# Patient Record
Sex: Female | Born: 1995 | Race: White | Hispanic: No | Marital: Single | Smoking: Never smoker
Health system: Southern US, Community
[De-identification: ages and names within clinical notes are randomized; demographics above are authoritative.]

## PROBLEM LIST (undated history)

## (undated) DIAGNOSIS — E039 Hypothyroidism, unspecified: Secondary | ICD-10-CM

## (undated) DIAGNOSIS — Z789 Other specified health status: Secondary | ICD-10-CM

## (undated) DIAGNOSIS — E079 Disorder of thyroid, unspecified: Secondary | ICD-10-CM

## (undated) HISTORY — DX: Other specified health status: Z78.9

## (undated) HISTORY — DX: Disorder of thyroid, unspecified: E07.9

---
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
## 2015-01-09 DIAGNOSIS — E034 Atrophy of thyroid (acquired): Secondary | ICD-10-CM | POA: Insufficient documentation

## 2015-03-24 ENCOUNTER — Ambulatory Visit: Admission: EM | Admit: 2015-03-24 | Discharge: 2015-03-24 | Discharge status: Absent without leave | Payer: Self-pay

## 2015-03-25 ENCOUNTER — Other Ambulatory Visit: Payer: Self-pay

## 2015-03-25 DIAGNOSIS — Z0000 Encounter for general adult medical examination without abnormal findings: Secondary | ICD-10-CM | POA: Insufficient documentation

## 2015-03-25 DIAGNOSIS — E039 Hypothyroidism, unspecified: Secondary | ICD-10-CM | POA: Insufficient documentation

## 2015-03-25 DIAGNOSIS — Z111 Encounter for screening for respiratory tuberculosis: Secondary | ICD-10-CM | POA: Insufficient documentation

## 2015-03-25 DIAGNOSIS — Z3009 Encounter for other general counseling and advice on contraception: Secondary | ICD-10-CM | POA: Insufficient documentation

## 2015-03-26 ENCOUNTER — Encounter: Payer: Self-pay | Admitting: Family Medicine

## 2015-03-26 ENCOUNTER — Ambulatory Visit (INDEPENDENT_AMBULATORY_CARE_PROVIDER_SITE_OTHER): Payer: BC Managed Care – PPO | Admitting: Family Medicine

## 2015-03-26 VITALS — BP 100/80 | HR 80 | Ht 68.0 in | Wt 179.0 lb

## 2015-03-26 DIAGNOSIS — Z3041 Encounter for surveillance of contraceptive pills: Secondary | ICD-10-CM

## 2015-03-26 DIAGNOSIS — Z0000 Encounter for general adult medical examination without abnormal findings: Secondary | ICD-10-CM | POA: Diagnosis not present

## 2015-03-26 MED ORDER — NORGESTIMATE-ETH ESTRADIOL 0.25-35 MG-MCG PO TABS: 1.0000 | ORAL_TABLET | Freq: Every day | ORAL | Status: DC

## 2015-03-26 NOTE — Progress Notes (Signed)
Name: Kendra Salinas   MRN: [id number]    DOB: 1996/09/09   Date:03/26/2015       Progress Note  Subjective  Chief Complaint  Chief Complaint  Patient presents with  . Annual Exam    pain during intercourse at times    HPI Comments: Contraceptive management/ No side effects/ Tolerating well/ Effective/  Gynecologic Exam The patient's pertinent negatives include no genital itching, genital lesions, genital odor, genital rash, missed menses, pelvic pain, vaginal bleeding or vaginal discharge. This is a new problem. The current episode started today. Pertinent negatives include no abdominal pain, anorexia, back pain, chills, constipation, diarrhea, discolored urine, dysuria, fever, flank pain, frequency, headaches, hematuria, joint pain, joint swelling, nausea, painful intercourse, rash, sore throat, urgency or vomiting. The vaginal discharge was normal. The vaginal bleeding is typical of menses. She has not been passing clots. She has not been passing tissue. Nothing aggravates the symptoms. She uses oral contraceptives for contraception. Her menstrual history has been regular. There is no history of an abdominal surgery, a Cesarean section, an ectopic pregnancy, endometriosis, a gynecological surgery, herpes simplex, menorrhagia, metrorrhagia, miscarriage, ovarian cysts, perineal abscess, PID, an STD, a terminated pregnancy or vaginosis.    No problem-specific assessment & plan notes found for this encounter.   No past medical history on file.  No past surgical history on file.  No family history on file.  History   Social History  . Marital Status: Single    Spouse Name: N/A  . Number of Children: N/A  . Years of Education: N/A   Occupational History  . Not on file.   Social History Main Topics  . Smoking status: Never Smoker   . Smokeless tobacco: Not on file  . Alcohol Use: No  . Drug Use: No  . Sexual Activity: Not on file   Other Topics Concern  . Not on file    Social History Narrative  . No narrative on file    Allergies  Allergen Reactions  . Sulfa Antibiotics      Review of Systems  Constitutional: Negative for fever, chills, weight loss and malaise/fatigue.  HENT: Negative for ear discharge, ear pain and sore throat.   Eyes: Negative for blurred vision.  Respiratory: Negative for cough, sputum production, shortness of breath and wheezing.   Cardiovascular: Negative for chest pain, palpitations and leg swelling.  Gastrointestinal: Negative for heartburn, nausea, vomiting, abdominal pain, diarrhea, constipation, blood in stool, melena and anorexia.  Genitourinary: Negative for dysuria, urgency, frequency, hematuria, flank pain, vaginal discharge, pelvic pain, menorrhagia and missed menses.  Musculoskeletal: Negative for myalgias, back pain, joint pain and neck pain.  Skin: Negative for rash.  Neurological: Negative for dizziness, tingling, sensory change, focal weakness and headaches.  Endo/Heme/Allergies: Negative for environmental allergies and polydipsia. Does not bruise/bleed easily.  Psychiatric/Behavioral: Negative for depression and suicidal ideas. The patient is not nervous/anxious and does not have insomnia.      Objective  Filed Vitals:   03/26/15 0842  BP: 100/80  Pulse: 80  Height: 5\' 8"  (1.727 m)  Weight: 179 lb (81.194 kg)    Physical Exam  Constitutional: She is oriented to person, place, and time and well-developed, well-nourished, and in no distress.  HENT:  Head: Normocephalic and atraumatic.  Right Ear: External ear normal.  Left Ear: External ear normal.  Nose: Nose normal.  Mouth/Throat: Oropharynx is clear and moist.  Eyes: Conjunctivae and EOM are normal. Pupils are equal, round, and reactive to light.  Neck: Normal range of motion. Neck supple. No JVD present. No thyromegaly present.  Cardiovascular: Regular rhythm.  Exam reveals no gallop and no friction rub.   No murmur heard. Pulmonary/Chest:  Effort normal and breath sounds normal. No respiratory distress. She has no wheezes. She has no rales. She exhibits no tenderness.  Abdominal: Soft. Bowel sounds are normal. She exhibits no distension. There is no tenderness. There is no guarding.  Genitourinary: Rectum normal, vagina normal, uterus normal, cervix normal, right adnexa normal, left adnexa normal and vulva normal. Guaiac negative stool. No vaginal discharge found.  cevicaleversion  Musculoskeletal: Normal range of motion.  Lymphadenopathy:    She has no cervical adenopathy.  Neurological: She is alert and oriented to person, place, and time.  Skin: Skin is warm.  Psychiatric: Mood and affect normal.      No results found for this or any previous visit (from the past 2160 hour(s)).   Assessment & Plan  Problem List Items Addressed This Visit    None    Visit Diagnoses    Encounter for surveillance of contraceptive pills    -  Primary    Relevant Medications    norgestimate-ethinyl estradiol (SPRINTEC 28) 0.25-35 MG-MCG tablet         Dr. [facility]  03/26/2015

## 2015-03-31 LAB — PAP IG AND HPV HIGH-RISK
HPV, HIGH-RISK: NEGATIVE
PAP Smear Comment: 0

## 2015-11-05 ENCOUNTER — Encounter: Payer: Self-pay | Admitting: Family Medicine

## 2015-11-05 ENCOUNTER — Ambulatory Visit (INDEPENDENT_AMBULATORY_CARE_PROVIDER_SITE_OTHER): Payer: Managed Care, Other (non HMO) | Admitting: Family Medicine

## 2015-11-05 VITALS — BP 120/80 | HR 80 | Ht 68.0 in | Wt 169.0 lb

## 2015-11-05 DIAGNOSIS — K529 Noninfective gastroenteritis and colitis, unspecified: Secondary | ICD-10-CM | POA: Diagnosis not present

## 2015-11-05 LAB — HEMOCCULT GUIAC POC 1CARD (OFFICE): Fecal Occult Blood, POC: NEGATIVE

## 2015-11-05 NOTE — Progress Notes (Signed)
Name: Kendra Salinas   MRN: [id number]    DOB: April 07, 1996   Date:11/05/2015       Progress Note  Subjective  Chief Complaint  Chief Complaint  Patient presents with  . Irregular bowel movements    have diarrhea for approx 4-7 days at a time with "mucus in it" then constipated for approx 4 days after- has tried pepto bismol otc that helps temporarily    Diarrhea  This is a chronic problem. The current episode started more than 1 month ago. The problem occurs 5 to 10 times per day. The problem has been gradually worsening. The stool consistency is described as mucous. Associated symptoms include abdominal pain and increased flatus. Pertinent negatives include no bloating, chills, fever or weight loss. Associated symptoms comments: Pain radiates to lower back. The symptoms are aggravated by dairy products. There are no known risk factors. She has tried change of diet for the symptoms. The treatment provided no relief. Her past medical history is significant for irritable bowel syndrome.    No problem-specific assessment & plan notes found for this encounter.   Past Medical History  Diagnosis Date  . Thyroid disease     History reviewed. No pertinent past surgical history.  History reviewed. No pertinent family history.  Social History   Social History  . Marital Status: Single    Spouse Name: N/A  . Number of Children: N/A  . Years of Education: N/A   Occupational History  . Not on file.   Social History Main Topics  . Smoking status: Never Smoker   . Smokeless tobacco: Not on file  . Alcohol Use: No  . Drug Use: No  . Sexual Activity: Yes   Other Topics Concern  . Not on file   Social History Narrative    Allergies  Allergen Reactions  . Sulfa Antibiotics      Review of Systems  Constitutional: Negative for fever, chills and weight loss.  Gastrointestinal: Positive for abdominal pain, diarrhea and flatus. Negative for bloating.     Objective  Filed  Vitals:   11/05/15 1057  BP: 120/80  Pulse: 80  Height:  (1.727 m)  Weight: 169 lb (76.658 kg)    Physical Exam  Constitutional: She is well-developed, well-nourished, and in no distress. No distress.  HENT:  Head: Normocephalic and atraumatic.  Right Ear: External ear normal.  Left Ear: External ear normal.  Nose: Nose normal.  Mouth/Throat: Oropharynx is clear and moist.  Eyes: Conjunctivae and EOM are normal. Pupils are equal, round, and reactive to light. Right eye exhibits no discharge. Left eye exhibits no discharge.  Neck: Normal range of motion. Neck supple. No JVD present. No thyromegaly present.  Cardiovascular: Normal rate, regular rhythm, normal heart sounds and intact distal pulses.  Exam reveals no gallop and no friction rub.   No murmur heard. Pulmonary/Chest: Effort normal and breath sounds normal.  Abdominal: Soft. Bowel sounds are normal. She exhibits no mass. There is no splenomegaly or hepatomegaly. There is no tenderness. There is no guarding.  Genitourinary: Rectum normal. Rectal exam shows no external hemorrhoid, no internal hemorrhoid, no mass and no tenderness. Guaiac negative stool.  Musculoskeletal: Normal range of motion. She exhibits no edema.  Lymphadenopathy:    She has no cervical adenopathy.  Neurological: She is alert. She has normal reflexes.  Skin: Skin is warm and dry. She is not diaphoretic.  Psychiatric: Mood and affect normal.      Assessment & Plan  Problem List Items Addressed This Visit    None    Visit Diagnoses    Chronic diarrhea    -  Primary    Relevant Orders    CBC w/Diff/Platelet    Renal Function Panel    POCT Occult Blood Stool (Completed)    Ambulatory referral to Gastroenterology         Dr. Elizabeth Sauer [facility]  11/05/2015

## 2015-11-05 NOTE — Patient Instructions (Signed)
Crohn Disease Crohn disease is a long-lasting (chronic) disease that affects your gastrointestinal (GI) tract. It often causes irritation and swelling (inflammation) in your small intestine and the beginning of your large intestine. However, it can affect any part of your GI tract. Crohn disease is part of a group of illnesses that are known as inflammatory bowel disease (IBD). Crohn disease may start slowly and get worse over time. Symptoms may come and go. They may also disappear for months or even years at a time (remission). CAUSES The exact cause of Crohn disease is not known. It may be a response that causes your body's defense system (immune system) to mistakenly attack healthy cells and tissues (autoimmune response). Your genes and your environment may also play a role. RISK FACTORS You may be at greater risk for Crohn disease if you:  Have other family members with Crohn disease or another IBD.  Use any tobacco products, including cigarettes, chewing tobacco, or electronic cigarettes.  Are in your 20s.  Have Eastern European ancestry. SIGNS AND SYMPTOMS The main signs and symptoms of Crohn disease involve your GI tract. These include:  Diarrhea.  Rectal bleeding.  An urgent need to move your bowels.  The feeling that you are not finished having a bowel movement.  Abdominal pain or cramping.  Constipation. General signs and symptoms of Crohn disease may also include:  Unexplained weight loss.  Fatigue.  Fever.  Nausea.  Loss of appetite.  Joint pain  Changes in vision.  Red bumps on your skin. DIAGNOSIS Your health care provider may suspect Crohn disease based on your symptoms and your medical history. Your health care provider will do a physical exam. You may need to see a health care provider who specializes in diseases of the digestive tract (gastroenterologist). You may also have tests to help your health care providers make a diagnosis. These may  include:  Blood tests.  Stool sample tests.  Imaging tests, such as X-rays and CT scans.  Tests to examine the inside of your intestines using a long, flexible tube that has a light and a camera on the end (endoscopy or colonoscopy).  A procedure to take tissue samples from inside your bowel (biopsy) to be examined under a microscope. TREATMENT  There is no cure for Crohn disease. Treatment will focus on managing your symptoms. Crohn disease affects each person differently. Your treatment may include:  Resting your bowels. Drinking only clear liquids or getting nutrition through an IV for a period of time gives your bowels a chance to heal because they are not passing stools.  Medicines. These may be used alone or in combination (combination therapy). These may include antibiotic medicines. You may be given medicines that help to:  Reduce inflammation.  Control your immune system activity.  Fight infections.  Relieve cramps and prevent diarrhea.  Control your pain.  Surgery. You may need surgery if:  Medicines and other treatments are no longer working.  You develop complications from severe Crohn disease.  A section of your intestine becomes so damaged that it needs to be removed. HOME CARE INSTRUCTIONS  Take medicines only as directed by your health care provider.  If you were prescribed an antibiotic medicine, finish it all even if you start to feel better.  Keep all follow-up visits as directed by your health care provider. This is important.  Talk with your health care provider about changing your diet. This may help your symptoms. Your health care provide may recommend changes, such   as:  Drinking more fluids.  Avoiding milk and other foods that contain lactose.  Eating a low-fat diet.  Avoiding high-fiber foods, such as popcorn and nuts.  Avoiding carbonated beverages, such as soda.  Eating smaller meals more often rather than eating large  meals.  Keeping a food diary to identify foods that make your symptoms better or worse.  Do not use any tobacco products, including cigarettes, chewing tobacco, or electronic cigarettes. If you need help quitting, ask your health care provider.  Limit alcohol intake to no more than 1 drink per day for nonpregnant women and 2 drinks per day for men. One drink equals 12 ounces of beer, 5 ounces of wine, or 1 ounces of hard liquor.  Exercise daily or as directed by your health care provider. SEEK MEDICAL CARE IF:  You have diarrhea, abdominal cramps, and other gastrointestinal problems that are present almost all of the time.  Your symptoms do not improve with treatment.  You continue to lose weight.  You develop a rash or sores on your skin.  You develop eye problems.  You have a fever.   Your symptoms get worse.  You develop new symptoms. SEEK IMMEDIATE MEDICAL CARE IF:  You have bloody diarrhea.  You develop severe abdominal pain.  You cannot pass stools.   This information is not intended to replace advice given to you by your health care provider. Make sure you discuss any questions you have with your health care provider.   Document Released: 07/13/2005 Document Revised: 10/24/2014 Document Reviewed: 05/21/2014 Elsevier Interactive Patient Education 2016 [employer]  

## 2015-11-06 LAB — CBC WITH DIFFERENTIAL/PLATELET
BASOS: 1 %
Basophils Absolute: 0.1 10*3/uL (ref 0.0–0.2)
EOS (ABSOLUTE): 0.2 10*3/uL (ref 0.0–0.4)
EOS: 3 %
HEMATOCRIT: 39.3 % (ref 34.0–46.6)
Hemoglobin: 12.9 g/dL (ref 11.1–15.9)
IMMATURE GRANULOCYTES: 0 %
Immature Grans (Abs): 0 10*3/uL (ref 0.0–0.1)
Lymphocytes Absolute: 3.2 10*3/uL — ABNORMAL HIGH (ref 0.7–3.1)
Lymphs: 45 %
MCH: 27.9 pg (ref 26.6–33.0)
MCHC: 32.8 g/dL (ref 31.5–35.7)
MCV: 85 fL (ref 79–97)
Monocytes Absolute: 0.3 10*3/uL (ref 0.1–0.9)
Monocytes: 5 %
NEUTROS ABS: 3.3 10*3/uL (ref 1.4–7.0)
NEUTROS PCT: 46 %
PLATELETS: 330 10*3/uL (ref 150–379)
RBC: 4.62 x10E6/uL (ref 3.77–5.28)
RDW: 12.8 % (ref 12.3–15.4)
WBC: 7.1 10*3/uL (ref 3.4–10.8)

## 2015-11-06 LAB — RENAL FUNCTION PANEL
Albumin: 4.3 g/dL (ref 3.5–5.5)
BUN / CREAT RATIO: 13 (ref 8–20)
BUN: 10 mg/dL (ref 6–20)
CALCIUM: 9.5 mg/dL (ref 8.7–10.2)
CO2: 21 mmol/L (ref 18–29)
Chloride: 104 mmol/L (ref 96–106)
Creatinine, Ser: 0.77 mg/dL (ref 0.57–1.00)
GFR, EST AFRICAN AMERICAN: 127 mL/min/{1.73_m2} (ref >59)
GFR, EST NON AFRICAN AMERICAN: 110 mL/min/{1.73_m2} (ref >59)
Glucose: 89 mg/dL (ref 65–99)
Phosphorus: 3.4 mg/dL (ref 2.5–4.5)
Potassium: 4.7 mmol/L (ref 3.5–5.2)
SODIUM: 142 mmol/L (ref 134–144)

## 2015-12-28 ENCOUNTER — Encounter: Payer: Self-pay | Admitting: *Deleted

## 2015-12-29 ENCOUNTER — Ambulatory Visit: Payer: Managed Care, Other (non HMO) | Admitting: Certified Registered Nurse Anesthetist

## 2015-12-29 ENCOUNTER — Encounter: Payer: Self-pay | Admitting: *Deleted

## 2015-12-29 ENCOUNTER — Encounter
Admission: RE | Disposition: A | Discharge status: Home / Self Care | Payer: Self-pay | Source: Ambulatory Visit | Attending: Gastroenterology

## 2015-12-29 ENCOUNTER — Ambulatory Visit
Admission: RE | Admit: 2015-12-29 | Discharge: 2015-12-29 | Disposition: A | Discharge status: Home / Self Care | Payer: Managed Care, Other (non HMO) | Source: Ambulatory Visit | Attending: Gastroenterology | Admitting: Gastroenterology

## 2015-12-29 DIAGNOSIS — Z91040 Latex allergy status: Secondary | ICD-10-CM | POA: Diagnosis not present

## 2015-12-29 DIAGNOSIS — R1033 Periumbilical pain: Secondary | ICD-10-CM | POA: Diagnosis present

## 2015-12-29 DIAGNOSIS — E039 Hypothyroidism, unspecified: Secondary | ICD-10-CM | POA: Diagnosis not present

## 2015-12-29 DIAGNOSIS — Z79899 Other long term (current) drug therapy: Secondary | ICD-10-CM | POA: Diagnosis not present

## 2015-12-29 DIAGNOSIS — K210 Gastro-esophageal reflux disease with esophagitis: Secondary | ICD-10-CM | POA: Insufficient documentation

## 2015-12-29 DIAGNOSIS — K2950 Unspecified chronic gastritis without bleeding: Secondary | ICD-10-CM | POA: Insufficient documentation

## 2015-12-29 DIAGNOSIS — Z882 Allergy status to sulfonamides status: Secondary | ICD-10-CM | POA: Diagnosis not present

## 2015-12-29 HISTORY — PX: ESOPHAGOGASTRODUODENOSCOPY (EGD) WITH PROPOFOL: SHX5813

## 2015-12-29 HISTORY — DX: Hypothyroidism, unspecified: E03.9

## 2015-12-29 LAB — POCT PREGNANCY, URINE: PREG TEST UR: NEGATIVE

## 2015-12-29 SURGERY — ESOPHAGOGASTRODUODENOSCOPY (EGD) WITH PROPOFOL
Anesthesia: General

## 2015-12-29 MED ORDER — ONDANSETRON HCL 4 MG/2ML IJ SOLN
4.0000 mg | Freq: Once | INTRAMUSCULAR | Status: AC
  Administered 2015-12-29: 4 mg via INTRAVENOUS

## 2015-12-29 MED ORDER — GLYCOPYRROLATE 0.2 MG/ML IJ SOLN
INTRAMUSCULAR | Status: DC | PRN
  Administered 2015-12-29: 0.2 mg via INTRAVENOUS

## 2015-12-29 MED ORDER — ONDANSETRON HCL 4 MG/2ML IJ SOLN
INTRAMUSCULAR | Status: AC
  Administered 2015-12-29: 4 mg via INTRAVENOUS
  Filled 2015-12-29: qty 2

## 2015-12-29 MED ORDER — FENTANYL CITRATE (PF) 100 MCG/2ML IJ SOLN
INTRAMUSCULAR | Status: DC | PRN
  Administered 2015-12-29: 50 ug via INTRAVENOUS

## 2015-12-29 MED ORDER — PROPOFOL 10 MG/ML IV BOLUS
INTRAVENOUS | Status: DC | PRN
  Administered 2015-12-29: 100 mg via INTRAVENOUS

## 2015-12-29 MED ORDER — PROPOFOL 500 MG/50ML IV EMUL
INTRAVENOUS | Status: DC | PRN
  Administered 2015-12-29: 200 ug/kg/min via INTRAVENOUS

## 2015-12-29 MED ORDER — LIDOCAINE HCL (CARDIAC) 20 MG/ML IV SOLN
INTRAVENOUS | Status: DC | PRN
  Administered 2015-12-29: 100 mg via INTRAVENOUS

## 2015-12-29 MED ORDER — MIDAZOLAM HCL 5 MG/5ML IJ SOLN
INTRAMUSCULAR | Status: DC | PRN
  Administered 2015-12-29: 2 mg via INTRAVENOUS

## 2015-12-29 MED ORDER — SODIUM CHLORIDE 0.9 % IV SOLN
INTRAVENOUS | Status: DC
  Administered 2015-12-29: 12:00:00 via INTRAVENOUS

## 2015-12-29 MED ORDER — SODIUM CHLORIDE 0.9 % IV SOLN: INTRAVENOUS | Status: DC

## 2015-12-29 NOTE — Transfer of Care (Signed)
Immediate Anesthesia Transfer of Care Note  Patient: Kendra Salinas  Procedure(s) Performed: Procedure(s): ESOPHAGOGASTRODUODENOSCOPY (EGD) WITH PROPOFOL (N/A)  Patient Location: PACU  Anesthesia Type:General  Level of Consciousness: awake, alert , oriented and patient cooperative  Airway & Oxygen Therapy: Patient Spontanous Breathing  Post-op Assessment: Report given to RN and Post -op Vital signs reviewed and stable  Post vital signs: Reviewed and stable  Last Vitals:  Filed Vitals:   12/29/15 1208 12/29/15 1322  BP: 114/72 104/61  Pulse: 62 57  Temp: 36.7 C 36.6 C  Resp: 18 16    Complications: No apparent anesthesia complications

## 2015-12-29 NOTE — Anesthesia Preprocedure Evaluation (Signed)
Anesthesia Evaluation  Patient identified by MRN, date of birth, ID band Patient awake    Reviewed: Allergy & Precautions, NPO status , Patient's Chart, lab work & pertinent test results, reviewed documented beta blocker date and time   Airway Mallampati: II  TM Distance: >3 FB     Dental  (+) Chipped   Pulmonary           Cardiovascular      Neuro/Psych    GI/Hepatic   Endo/Other  Hypothyroidism   Renal/GU      Musculoskeletal   Abdominal   Peds  Hematology   Anesthesia Other Findings GI problems.  Reproductive/Obstetrics                             Anesthesia Physical Anesthesia Plan  ASA: II  Anesthesia Plan: General   Post-op Pain Management:    Induction: Intravenous  Airway Management Planned: Nasal Cannula  Additional Equipment:   Intra-op Plan:   Post-operative Plan:   Informed Consent: I have reviewed the patients History and Physical, chart, labs and discussed the procedure including the risks, benefits and alternatives for the proposed anesthesia with the patient or authorized representative who has indicated his/her understanding and acceptance.     Plan Discussed with: CRNA  Anesthesia Plan Comments:         Anesthesia Quick Evaluation

## 2015-12-29 NOTE — Op Note (Signed)
[facility] Gastroenterology Patient Name: Kendra Salinas Procedure Date: 12/29/2015 12:52 PM MRN: [id number] Account #: [id number] Date of Birth: Feb 07, 1996 Admit Type: Outpatient Age: 20 Room: [facility] ENDO ROOM 3 Gender: Female Note Status: Finalized Procedure:            Upper GI endoscopy Indications:          Periumbilical abdominal pain, Endoscopy to assess                        diarrhea in patient suspected of having celiac disease,                        Nausea with vomiting Providers:            Christena Deem, MD Referring MD:         Duanne Limerick, MD (Referring MD) Medicines:            Monitored Anesthesia Care Complications:        No immediate complications. Procedure:            Pre-Anesthesia Assessment:                       - ASA Grade Assessment: II - A patient with mild                        systemic disease.                       After obtaining informed consent, the endoscope was                        passed under direct vision. Throughout the procedure,                        the patient's blood pressure, pulse, and oxygen                        saturations were monitored continuously. The Endoscope                        was introduced through the mouth, and advanced to the                        fourth part of duodenum. The upper GI endoscopy was                        accomplished without difficulty. The patient tolerated                        the procedure well. Findings:      LA Grade A (one or more mucosal breaks less than 5 mm, not extending       between tops of 2 mucosal folds) esophagitis with no bleeding was found.       Biopsies were taken with a cold forceps for histology.      The entire examined stomach was normal. Biopsies were taken with a cold       forceps for histology.      Diffuse mucosal flattening was found in the entire examined duodenum.       Biopsies for histology were taken with a cold  forceps for  evaluation of       celiac disease.      The exam was otherwise without abnormality. Impression:           - LA Grade A reflux esophagitis. Biopsied.                       - Normal stomach. Biopsied.                       - Flattened mucosa was found in the duodenum,                        suspicious for celiac disease. Biopsied.                       - The examination was otherwise normal. Recommendation:       - Await pathology results.                       - Return to GI clinic in 3 weeks. Procedure Code(s):    --- Professional ---                       [phone], Esophagogastroduodenoscopy, flexible, transoral;                        with biopsy, single or multiple Diagnosis Code(s):    --- Professional ---                       K21.0, Gastro-esophageal reflux disease with esophagitis                       K31.89, Other diseases of stomach and duodenum                       R10.33, Periumbilical pain                       R19.7, Diarrhea, unspecified                       R11.2, Nausea with vomiting, unspecified CPT copyright 2016 American Medical Association. All rights reserved. The codes documented in this report are preliminary and upon coder review may  be revised to meet current compliance requirements. Christena DeemMartin U Skulskie, MD 12/29/2015 1:16:13 PM This report has been signed electronically. Number of Addenda: 0 Note Initiated On: 12/29/2015 12:52 PM      [facility]

## 2015-12-29 NOTE — H&P (Signed)
Outpatient short stay form Pre-procedure 12/29/2015 12:45 PM Christena DeemMartin U Skulskie MD  Primary Physician: Dr. p.m. Yetta BarreJones  Reason for visit:  EGD  History of present illness:  Patient is a 20 year old female presenting today for EGD. She has a history of nausea vomiting change of bowel habits with diarrhea. 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 She is presenting today for EGD with biopsy. He takes no aspirin product or blood thinning agents.    Current facility-administered medications:  .  0.9 %  sodium chloride infusion, , Intravenous, Continuous, Christena DeemMartin U Skulskie, MD, Last Rate: 20 mL/hr at 12/29/15 1227 .  0.9 %  sodium chloride infusion, , Intravenous, Continuous, Christena DeemMartin U Skulskie, MD  Prescriptions prior to admission  Medication Sig Dispense Refill Last Dose  . methylcellulose (CITRUCEL) oral powder Take 1 packet by mouth daily.     . Probiotic Product (ALIGN PO) Take 10.5 mg by mouth.     . levothyroxine (SYNTHROID, LEVOTHROID) 88 MCG tablet Take 1 tablet by mouth daily.   Taking  . norgestimate-ethinyl estradiol (SPRINTEC 28) 0.25-35 MG-MCG tablet Take 1 tablet by mouth daily. 3 Package 4 Taking     Allergies  Allergen Reactions  . Latex Swelling  . Sulfa Antibiotics      Past Medical History  Diagnosis Date  . Thyroid disease   . Hypothyroidism     Review of systems:      Physical Exam    Heart and lungs: Regular rate and rhythm without rub or gallop, lungs are bilaterally clear.    HEENT: Normocephalic atraumatic eyes are anicteric    Other:     Pertinant exam for procedure: Soft nontender nondistended bowel sounds are positive normoactive.    Planned proceedures: EGD and indicated procedures. I have discussed the risks benefits and complications of procedures to include not limited to bleeding, infection, perforation and the risk of sedation  and the patient wishes to proceed.    Christena DeemMartin U Skulskie, MD Gastroenterology 12/29/2015  12:45 PM

## 2015-12-30 LAB — SURGICAL PATHOLOGY

## 2016-01-01 NOTE — Anesthesia Postprocedure Evaluation (Signed)
Anesthesia Post Note  Patient: Kendra Salinas  Procedure(s) Performed: Procedure(s) (LRB): ESOPHAGOGASTRODUODENOSCOPY (EGD) WITH PROPOFOL (N/A)  Patient location during evaluation: Endoscopy Anesthesia Type: General Level of consciousness: awake and alert Pain management: pain level controlled Vital Signs Assessment: post-procedure vital signs reviewed and stable Respiratory status: spontaneous breathing, nonlabored ventilation, respiratory function stable and patient connected to nasal cannula oxygen Cardiovascular status: blood pressure returned to baseline and stable Postop Assessment: no signs of nausea or vomiting Anesthetic complications: no    Last Vitals:  Filed Vitals:   12/29/15 1341 12/29/15 1351  BP: 109/86 104/73  Pulse: 57 49  Temp:    Resp: 11 11    Last Pain: There were no vitals filed for this visit.               Vail Basista S

## 2016-05-18 ENCOUNTER — Other Ambulatory Visit: Payer: Self-pay | Admitting: Family Medicine

## 2016-05-18 DIAGNOSIS — Z3041 Encounter for surveillance of contraceptive pills: Secondary | ICD-10-CM

## 2016-07-07 ENCOUNTER — Encounter: Payer: Self-pay | Admitting: Family Medicine

## 2016-07-07 ENCOUNTER — Ambulatory Visit (INDEPENDENT_AMBULATORY_CARE_PROVIDER_SITE_OTHER): Payer: Managed Care, Other (non HMO) | Admitting: Family Medicine

## 2016-07-07 VITALS — BP 124/72 | HR 73 | Ht 68.0 in | Wt 147.0 lb

## 2016-07-07 DIAGNOSIS — Z23 Encounter for immunization: Secondary | ICD-10-CM

## 2016-07-07 DIAGNOSIS — E039 Hypothyroidism, unspecified: Secondary | ICD-10-CM

## 2016-07-07 DIAGNOSIS — Z0000 Encounter for general adult medical examination without abnormal findings: Secondary | ICD-10-CM | POA: Diagnosis not present

## 2016-07-07 MED ORDER — LEVOTHYROXINE SODIUM 88 MCG PO TABS: 88.0000 ug | ORAL_TABLET | Freq: Every day | ORAL | 1 refills | Status: DC

## 2016-07-07 NOTE — Progress Notes (Signed)
Name: Kendra Salinas   MRN: [id number]    DOB: 01-20-1996   Date:07/07/2016       Progress Note  Subjective  Chief Complaint  Chief Complaint  Patient presents with  . Annual Exam    no pap- needs birth control refilled  . Flu Vaccine  . Hypothyroidism    recheck due to hair thinning    Thyroid Problem  Presents for follow-up visit. Symptoms include hair loss. Patient reports no anxiety, cold intolerance, constipation, depressed mood, diaphoresis, diarrhea, dry skin, fatigue, heat intolerance, hoarse voice, leg swelling, menstrual problem, nail problem, palpitations, tremors, visual change, weight gain or weight loss. The symptoms have been worsening.    No problem-specific Assessment & Plan notes found for this encounter.   Past Medical History:  Diagnosis Date  . Gluten free diet    DX with ciliac disease  . Hypothyroidism   . Thyroid disease     Past Surgical History:  Procedure Laterality Date  . ESOPHAGOGASTRODUODENOSCOPY (EGD) WITH PROPOFOL N/A 12/29/2015   Procedure: ESOPHAGOGASTRODUODENOSCOPY (EGD) WITH PROPOFOL;  Surgeon: Christena DeemMartin U Skulskie, MD;  [facility];  Service: Endoscopy;  Laterality: N/A;    History reviewed. No pertinent family history.  Social History   Social History  . Marital status: Single    Spouse name: N/A  . Number of children: N/A  . Years of education: N/A   Occupational History  . Not on file.   Social History Main Topics  . Smoking status: Never Smoker  . Smokeless tobacco: Never Used  . Alcohol use No  . Drug use: No  . Sexual activity: Yes   Other Topics Concern  . Not on file   Social History Narrative  . No narrative on file    Allergies  Allergen Reactions  . Latex Swelling  . Sulfa Antibiotics      Review of Systems  Constitutional: Negative for chills, diaphoresis, fatigue, fever, malaise/fatigue, weight gain and weight loss.  HENT: Negative for ear discharge, ear pain, hoarse voice and sore  throat.   Eyes: Negative for blurred vision.  Respiratory: Negative for cough, sputum production, shortness of breath and wheezing.   Cardiovascular: Negative for chest pain, palpitations and leg swelling.  Gastrointestinal: Negative for abdominal pain, blood in stool, constipation, diarrhea, heartburn, melena and nausea.  Genitourinary: Negative for dysuria, frequency, hematuria, menstrual problem and urgency.  Musculoskeletal: Negative for back pain, joint pain, myalgias and neck pain.  Skin: Negative for rash.  Neurological: Negative for dizziness, tingling, tremors, sensory change, focal weakness and headaches.  Endo/Heme/Allergies: Negative for environmental allergies, cold intolerance, heat intolerance and polydipsia. Does not bruise/bleed easily.  Psychiatric/Behavioral: Negative for depression and suicidal ideas. The patient is not nervous/anxious and does not have insomnia.      Objective  Vitals:   07/07/16 1040  BP: 124/72  Pulse: 73  Weight: 147 lb (66.7 kg)  Height: 5\' 8"  (1.727 m)    Physical Exam  Constitutional: She is well-developed, well-nourished, and in no distress. No distress.  HENT:  Head: Normocephalic and atraumatic.  Right Ear: External ear normal.  Left Ear: External ear normal.  Nose: Nose normal.  Mouth/Throat: Oropharynx is clear and moist.  Eyes: Conjunctivae and EOM are normal. Pupils are equal, round, and reactive to light. Right eye exhibits no discharge. Left eye exhibits no discharge.  Neck: Normal range of motion. Neck supple. No JVD present. No thyromegaly present.  Cardiovascular: Normal rate, regular rhythm, normal heart sounds and intact distal  pulses.  Exam reveals no gallop and no friction rub.   No murmur heard. Pulmonary/Chest: Effort normal and breath sounds normal.  Abdominal: Soft. Bowel sounds are normal. She exhibits no mass. There is no tenderness. There is no guarding.  Musculoskeletal: Normal range of motion. She exhibits no  edema.  Lymphadenopathy:    She has no cervical adenopathy.  Neurological: She is alert. She has normal reflexes.  Skin: Skin is warm and dry. She is not diaphoretic.  Psychiatric: Mood and affect normal.  Nursing note and vitals reviewed.     Assessment & Plan  Problem List Items Addressed This Visit      Endocrine   Adult hypothyroidism   Relevant Medications   levothyroxine (SYNTHROID, LEVOTHROID) 88 MCG tablet   Other Relevant Orders   Renal Function Panel   Thyroid Panel With TSH    Other Visit Diagnoses    Annual physical exam    -  Primary   Flu vaccine need       Relevant Orders   Flu Vaccine QUAD 36+ mos PF IM (Fluarix & Fluzone Quad PF) (Completed)        Dr. [facility]  07/07/16

## 2016-07-08 LAB — RENAL FUNCTION PANEL
ALBUMIN: 4.6 g/dL (ref 3.5–5.5)
BUN/Creatinine Ratio: 14 (ref 9–23)
BUN: 12 mg/dL (ref 6–20)
CALCIUM: 9.5 mg/dL (ref 8.7–10.2)
CHLORIDE: 102 mmol/L (ref 96–106)
CO2: 22 mmol/L (ref 18–29)
Creatinine, Ser: 0.84 mg/dL (ref 0.57–1.00)
GFR calc Af Amer: 114 mL/min/{1.73_m2} (ref >59)
GFR calc non Af Amer: 99 mL/min/{1.73_m2} (ref >59)
Glucose: 91 mg/dL (ref 65–99)
Phosphorus: 3.3 mg/dL (ref 2.5–4.5)
Potassium: 4 mmol/L (ref 3.5–5.2)
Sodium: 137 mmol/L (ref 134–144)

## 2016-07-08 LAB — THYROID PANEL WITH TSH
FREE THYROXINE INDEX: 3 (ref 1.2–4.9)
T3 UPTAKE RATIO: 24 % (ref 24–39)
T4, Total: 12.4 ug/dL — ABNORMAL HIGH (ref 4.5–12.0)
TSH: 0.567 u[IU]/mL (ref 0.450–4.500)

## 2016-07-11 ENCOUNTER — Encounter: Payer: Managed Care, Other (non HMO) | Admitting: Family Medicine

## 2016-07-18 ENCOUNTER — Other Ambulatory Visit: Payer: Self-pay

## 2016-07-18 DIAGNOSIS — Z3041 Encounter for surveillance of contraceptive pills: Secondary | ICD-10-CM

## 2016-07-18 MED ORDER — NORGESTIMATE-ETH ESTRADIOL 0.25-35 MG-MCG PO TABS: 1.0000 | ORAL_TABLET | Freq: Every day | ORAL | 5 refills | Status: DC

## 2016-08-10 ENCOUNTER — Other Ambulatory Visit: Payer: Self-pay | Admitting: Family Medicine

## 2016-08-10 DIAGNOSIS — Z3041 Encounter for surveillance of contraceptive pills: Secondary | ICD-10-CM

## 2016-08-29 ENCOUNTER — Other Ambulatory Visit: Payer: Self-pay | Admitting: Gastroenterology

## 2016-08-29 DIAGNOSIS — R1031 Right lower quadrant pain: Secondary | ICD-10-CM

## 2016-08-29 DIAGNOSIS — K900 Celiac disease: Secondary | ICD-10-CM | POA: Insufficient documentation

## 2016-08-30 ENCOUNTER — Ambulatory Visit
Admission: RE | Admit: 2016-08-30 | Discharge: 2016-08-30 | Disposition: A | Discharge status: Home / Self Care | Payer: Managed Care, Other (non HMO) | Source: Ambulatory Visit | Attending: Gastroenterology | Admitting: Gastroenterology

## 2016-08-30 DIAGNOSIS — R1031 Right lower quadrant pain: Secondary | ICD-10-CM | POA: Diagnosis present

## 2016-08-30 DIAGNOSIS — R198 Other specified symptoms and signs involving the digestive system and abdomen: Secondary | ICD-10-CM | POA: Insufficient documentation

## 2016-08-30 MED ORDER — IOPAMIDOL (ISOVUE-300) INJECTION 61%
100.0000 mL | Freq: Once | INTRAVENOUS | Status: AC | PRN
  Administered 2016-08-30: 100 mL via INTRAVENOUS

## 2016-11-14 ENCOUNTER — Ambulatory Visit (INDEPENDENT_AMBULATORY_CARE_PROVIDER_SITE_OTHER): Payer: Managed Care, Other (non HMO) | Admitting: Family Medicine

## 2016-11-14 ENCOUNTER — Encounter: Payer: Self-pay | Admitting: Family Medicine

## 2016-11-14 VITALS — BP 128/80 | HR 76 | Ht 68.0 in | Wt 140.0 lb

## 2016-11-14 DIAGNOSIS — R102 Pelvic and perineal pain: Secondary | ICD-10-CM

## 2016-11-14 DIAGNOSIS — N939 Abnormal uterine and vaginal bleeding, unspecified: Secondary | ICD-10-CM

## 2016-11-14 DIAGNOSIS — F419 Anxiety disorder, unspecified: Secondary | ICD-10-CM

## 2016-11-14 MED ORDER — SERTRALINE HCL 50 MG PO TABS: 50.0000 mg | ORAL_TABLET | Freq: Every day | ORAL | 3 refills | Status: DC

## 2016-11-14 NOTE — Progress Notes (Signed)
Name: Kendra Salinas   MRN: [id number]    DOB: 27-May-1996   Date:11/14/2016       Progress Note  Subjective  Chief Complaint  Chief Complaint  Patient presents with  . painful intercorse    bleeding during and randomly after intercourse. Has lower abdominal pain with it  . Anxiety    daily- happens while sitting still, heart starts to race    Anxiety  Presents for initial visit. Onset was 1 to 5 years ago. The problem has been waxing and waning. Symptoms include depressed mood, excessive worry, irritability, nervous/anxious behavior and panic. Patient reports no chest pain, dizziness, insomnia, nausea, palpitations, shortness of breath or suicidal ideas. Symptoms occur most days (2-3 times a week). The severity of symptoms is moderate.   Her past medical history is significant for anxiety/panic attacks. Past treatments include counseling (CBT). The treatment provided mild relief. Compliance with prior treatments has been poor.  Pelvic Pain  The patient's primary symptoms include pelvic pain, vaginal bleeding and vaginal discharge. The patient's pertinent negatives include no genital itching, genital lesions, genital odor, genital rash or missed menses. This is a new problem. The current episode started more than 1 year ago. The problem occurs daily. The problem has been waxing and waning. The pain is moderate. The problem affects both sides. Associated symptoms include flank pain. Pertinent negatives include no abdominal pain, anorexia, back pain, chills, constipation, diarrhea, discolored urine, dysuria, fever, frequency, headaches, hematuria, joint pain, joint swelling, nausea, painful intercourse, rash, sore throat, urgency or vomiting. The vaginal discharge was bloody (onset 2 months). The vaginal bleeding is spotting. She has not been passing clots. She has not been passing tissue. The symptoms are aggravated by intercourse. She has tried nothing for the symptoms. She is sexually active. She  uses oral contraceptives for contraception. There is no history of an abdominal surgery, a Cesarean section, a gynecological surgery, menorrhagia, metrorrhagia, PID or an STD.    No problem-specific Assessment & Plan notes found for this encounter.   Past Medical History:  Diagnosis Date  . Gluten free diet    DX with ciliac disease  . Hypothyroidism   . Thyroid disease     Past Surgical History:  Procedure Laterality Date  . ESOPHAGOGASTRODUODENOSCOPY (EGD) WITH PROPOFOL N/A 12/29/2015   Procedure: ESOPHAGOGASTRODUODENOSCOPY (EGD) WITH PROPOFOL;  Surgeon: Christena DeemMartin U Skulskie, MD;  [facility];  Service: Endoscopy;  Laterality: N/A;    No family history on file.  Social History   Social History  . Marital status: Single    Spouse name: N/A  . Number of children: N/A  . Years of education: N/A   Occupational History  . Not on file.   Social History Main Topics  . Smoking status: Never Smoker  . Smokeless tobacco: Never Used  . Alcohol use No  . Drug use: No  . Sexual activity: Yes   Other Topics Concern  . Not on file   Social History Narrative  . No narrative on file    Allergies  Allergen Reactions  . Latex Swelling  . Sulfa Antibiotics      Review of Systems  Constitutional: Positive for irritability. Negative for chills, fever, malaise/fatigue and weight loss.  HENT: Negative for ear discharge, ear pain and sore throat.   Eyes: Negative for blurred vision.  Respiratory: Negative for cough, sputum production, shortness of breath and wheezing.   Cardiovascular: Negative for chest pain, palpitations and leg swelling.  Gastrointestinal: Negative for abdominal  pain, anorexia, blood in stool, constipation, diarrhea, heartburn, melena, nausea and vomiting.  Genitourinary: Positive for flank pain, pelvic pain and vaginal discharge. Negative for dysuria, frequency, hematuria, menorrhagia, missed menses and urgency.  Musculoskeletal: Negative for back  pain, joint pain, myalgias and neck pain.  Skin: Negative for rash.  Neurological: Negative for dizziness, tingling, sensory change, focal weakness and headaches.  Endo/Heme/Allergies: Negative for environmental allergies and polydipsia. Does not bruise/bleed easily.  Psychiatric/Behavioral: Negative for depression and suicidal ideas. The patient is nervous/anxious. The patient does not have insomnia.      Objective  Vitals:   11/14/16 1405  BP: 128/80  Pulse: 76  Weight: 140 lb (63.5 kg)  Height: 5\' 8"  (1.727 m)    Physical Exam  Constitutional: She is well-developed, well-nourished, and in no distress. No distress.  HENT:  Head: Normocephalic and atraumatic.  Right Ear: External ear normal.  Left Ear: External ear normal.  Nose: Nose normal.  Mouth/Throat: Oropharynx is clear and moist.  Eyes: Conjunctivae and EOM are normal. Pupils are equal, round, and reactive to light. Right eye exhibits no discharge. Left eye exhibits no discharge.  Neck: Normal range of motion. Neck supple. No JVD present. No thyromegaly present.  Cardiovascular: Normal rate, regular rhythm, normal heart sounds and intact distal pulses.  Exam reveals no gallop and no friction rub.   No murmur heard. Pulmonary/Chest: Effort normal and breath sounds normal. She has no wheezes. She has no rales.  Abdominal: Soft. Bowel sounds are normal. She exhibits no mass. There is no tenderness. There is no rebound and no guarding.  Genitourinary: Vagina normal, uterus normal, cervix normal, right adnexa normal and left adnexa normal. No vaginal discharge found.  Musculoskeletal: Normal range of motion. She exhibits no edema.  Lymphadenopathy:    She has no cervical adenopathy.  Neurological: She is alert. She has normal reflexes.  Skin: Skin is warm and dry. She is not diaphoretic.  Psychiatric: Mood and affect normal.  Nursing note and vitals reviewed.     Assessment & Plan  Problem List Items Addressed This  Visit    None    Visit Diagnoses    Pelvic pain in female    -  Primary   Relevant Orders   Ambulatory referral to Gynecology   Vaginal bleeding       Relevant Orders   Ambulatory referral to Gynecology   Chronic anxiety       Relevant Medications   sertraline (ZOLOFT) 50 MG tablet        Dr. [facility]  11/14/16

## 2016-12-12 ENCOUNTER — Encounter: Payer: Self-pay | Admitting: Family Medicine

## 2016-12-12 ENCOUNTER — Ambulatory Visit (INDEPENDENT_AMBULATORY_CARE_PROVIDER_SITE_OTHER): Payer: Managed Care, Other (non HMO) | Admitting: Family Medicine

## 2016-12-12 VITALS — BP 120/70 | HR 84 | Ht 68.0 in | Wt 133.0 lb

## 2016-12-12 DIAGNOSIS — F419 Anxiety disorder, unspecified: Secondary | ICD-10-CM | POA: Diagnosis not present

## 2016-12-12 MED ORDER — BUSPIRONE HCL 5 MG PO TABS: 5.0000 mg | ORAL_TABLET | Freq: Three times a day (TID) | ORAL | 1 refills | Status: DC

## 2016-12-12 MED ORDER — ALPRAZOLAM 0.25 MG PO TABS: 0.2500 mg | ORAL_TABLET | Freq: Two times a day (BID) | ORAL | 0 refills | Status: DC | PRN

## 2016-12-12 NOTE — Progress Notes (Signed)
Name: Kendra Salinas   MRN: [id number]    DOB: Mar 09, 1996   Date:12/12/2016       Progress Note  Subjective  Chief Complaint  Chief Complaint  Patient presents with  . Depression    started Zoloft- "yawning all the time"- otherwise feeling "a little bit better"    Depression         This is a recurrent problem.  The current episode started more than 1 month ago.   The problem occurs daily.  The problem has been waxing and waning since onset.  Associated symptoms include no decreased concentration, no fatigue, no helplessness, no hopelessness, does not have insomnia, not irritable, no restlessness, no decreased interest, no appetite change, no body aches, no myalgias, no headaches, no indigestion, not sad and no suicidal ideas.( yawning)     The symptoms are aggravated by nothing.  Past treatments include SSRIs - Selective serotonin reuptake inhibitors.  Compliance with treatment is good.  Past compliance problems include medication issues.  Previous treatment provided moderate relief.  Past medical history includes anxiety.   Anxiety  Presents for follow-up visit. Patient reports no chest pain, compulsions, confusion, decreased concentration, depressed mood, dizziness, dry mouth, excessive worry, feeling of choking, hyperventilation, impotence, insomnia, irritability, malaise, muscle tension, nausea, nervous/anxious behavior, obsessions, palpitations, panic, restlessness, shortness of breath or suicidal ideas. Symptoms occur most days. The severity of symptoms is mild. The quality of sleep is good.      No problem-specific Assessment & Plan notes found for this encounter.   Past Medical History:  Diagnosis Date  . Gluten free diet    DX with ciliac disease  . Hypothyroidism   . Thyroid disease     Past Surgical History:  Procedure Laterality Date  . ESOPHAGOGASTRODUODENOSCOPY (EGD) WITH PROPOFOL N/A 12/29/2015   Procedure: ESOPHAGOGASTRODUODENOSCOPY (EGD) WITH PROPOFOL;  Surgeon:  Christena Deem, MD;  Location: [facility] ENDOSCOPY;  Service: Endoscopy;  Laterality: N/A;    No family history on file.  Social History   Social History  . Marital status: Single    Spouse name: N/A  . Number of children: N/A  . Years of education: N/A   Occupational History  . Not on file.   Social History Main Topics  . Smoking status: Never Smoker  . Smokeless tobacco: Never Used  . Alcohol use No  . Drug use: No  . Sexual activity: Yes   Other Topics Concern  . Not on file   Social History Narrative  . No narrative on file    Allergies  Allergen Reactions  . Latex Swelling  . Sulfa Antibiotics     Outpatient Medications Prior to Visit  Medication Sig Dispense Refill  . levothyroxine (SYNTHROID, LEVOTHROID) 88 MCG tablet Take 1 tablet (88 mcg total) by mouth daily. 90 tablet 1  . norgestimate-ethinyl estradiol (SPRINTEC 28) 0.25-35 MG-MCG tablet Take 1 tablet by mouth daily. 28 tablet 5  . omeprazole (PRILOSEC) 20 MG capsule Take 20 mg by mouth daily.    . sertraline (ZOLOFT) 50 MG tablet Take 1 tablet (50 mg total) by mouth daily. 30 tablet 3   No facility-administered medications prior to visit.     Review of Systems  Constitutional: Negative for appetite change, chills, fatigue, fever, irritability, malaise/fatigue and weight loss.  HENT: Negative for ear discharge, ear pain and sore throat.   Eyes: Negative for blurred vision.  Respiratory: Negative for cough, sputum production, shortness of breath and wheezing.   Cardiovascular: Negative for  chest pain, palpitations and leg swelling.  Gastrointestinal: Negative for abdominal pain, blood in stool, constipation, diarrhea, heartburn, melena and nausea.  Genitourinary: Negative for dysuria, frequency, hematuria, impotence and urgency.  Musculoskeletal: Negative for back pain, joint pain, myalgias and neck pain.  Skin: Negative for rash.  Neurological: Negative for dizziness, tingling, sensory change, focal  weakness and headaches.       Episodes of yawning  Endo/Heme/Allergies: Negative for environmental allergies and polydipsia. Does not bruise/bleed easily.  Psychiatric/Behavioral: Positive for depression. Negative for confusion, decreased concentration and suicidal ideas. The patient is not nervous/anxious and does not have insomnia.      Objective  Vitals:   12/12/16 1337  BP: 120/70  Pulse: 84  Weight: 133 lb (60.3 kg)  Height: 5\' 8"  (1.727 m)    Physical Exam  Constitutional: She is well-developed, well-nourished, and in no distress. She is not irritable. No distress.  HENT:  Head: Normocephalic and atraumatic.  Right Ear: External ear normal.  Left Ear: External ear normal.  Nose: Nose normal.  Mouth/Throat: Oropharynx is clear and moist.  Eyes: Conjunctivae and EOM are normal. Pupils are equal, round, and reactive to light. Right eye exhibits no discharge. Left eye exhibits no discharge.  Neck: Normal range of motion. Neck supple. No JVD present. No thyromegaly present.  Cardiovascular: Normal rate, regular rhythm, normal heart sounds and intact distal pulses.  Exam reveals no gallop and no friction rub.   No murmur heard. Pulmonary/Chest: Effort normal and breath sounds normal. She has no wheezes. She has no rales.  Abdominal: Soft. Bowel sounds are normal. She exhibits no mass. There is no tenderness. There is no guarding.  Musculoskeletal: Normal range of motion. She exhibits no edema.  Lymphadenopathy:    She has no cervical adenopathy.  Neurological: She is alert. She has normal reflexes.  Skin: Skin is warm and dry. She is not diaphoretic.  Psychiatric: Mood and affect normal.      Assessment & Plan  Problem List Items Addressed This Visit      Other   Chronic anxiety - Primary   Relevant Medications   busPIRone (BUSPAR) 5 MG tablet   ALPRAZolam (XANAX) 0.25 MG tablet      Meds ordered this encounter  Medications  . busPIRone (BUSPAR) 5 MG tablet     Sig: Take 1 tablet (5 mg total) by mouth 3 (three) times daily.    Dispense:  60 tablet    Refill:  1  . ALPRAZolam (XANAX) 0.25 MG tablet    Sig: Take 1 tablet (0.25 mg total) by mouth 2 (two) times daily as needed for anxiety.    Dispense:  20 tablet    Refill:  0      Dr. Elizabeth Sauereanna Bettey [facility]  12/12/16

## 2016-12-22 ENCOUNTER — Other Ambulatory Visit: Payer: Self-pay | Admitting: Gastroenterology

## 2016-12-22 DIAGNOSIS — R1032 Left lower quadrant pain: Secondary | ICD-10-CM

## 2016-12-27 ENCOUNTER — Ambulatory Visit
Admission: RE | Admit: 2016-12-27 | Discharge: 2016-12-27 | Disposition: A | Discharge status: Home / Self Care | Payer: 59 | Source: Ambulatory Visit | Attending: Gastroenterology | Admitting: Gastroenterology

## 2016-12-27 DIAGNOSIS — K639 Disease of intestine, unspecified: Secondary | ICD-10-CM | POA: Insufficient documentation

## 2016-12-27 DIAGNOSIS — R1032 Left lower quadrant pain: Secondary | ICD-10-CM | POA: Diagnosis not present

## 2017-01-24 ENCOUNTER — Ambulatory Visit: Payer: Managed Care, Other (non HMO) | Admitting: Family Medicine

## 2017-01-24 ENCOUNTER — Ambulatory Visit (INDEPENDENT_AMBULATORY_CARE_PROVIDER_SITE_OTHER): Payer: Managed Care, Other (non HMO) | Admitting: Family Medicine

## 2017-01-24 ENCOUNTER — Encounter: Payer: Self-pay | Admitting: Family Medicine

## 2017-01-24 VITALS — BP 108/78 | HR 80 | Temp 98.3°F | Resp 16 | Ht 68.0 in | Wt 138.6 lb

## 2017-01-24 DIAGNOSIS — F419 Anxiety disorder, unspecified: Secondary | ICD-10-CM | POA: Diagnosis not present

## 2017-01-24 DIAGNOSIS — N926 Irregular menstruation, unspecified: Secondary | ICD-10-CM | POA: Diagnosis not present

## 2017-01-24 LAB — POCT URINE PREGNANCY: Preg Test, Ur: NEGATIVE

## 2017-01-24 MED ORDER — BUSPIRONE HCL 5 MG PO TABS: 5.0000 mg | ORAL_TABLET | Freq: Three times a day (TID) | ORAL | 1 refills | Status: DC

## 2017-01-24 NOTE — Progress Notes (Signed)
Name: Kendra Salinas   MRN: [id number]    DOB: 06-23-1996   Date:01/24/2017       Progress Note  Subjective  Chief Complaint  Chief Complaint  Patient presents with  . Anxiety    New med is working better than Zoloft. Xanax does not help with panic attacks.   . Menstrual Problem    Nausea and missed period after having unprotected sex between pills and IUD. Has been 6 days but missed period date.     Anxiety  Presents for follow-up visit. Symptoms include excessive worry, nausea and nervous/anxious behavior. Patient reports no chest pain, compulsions, confusion, decreased concentration, depressed mood, dizziness, dry mouth, feeling of choking, hyperventilation, impotence, insomnia, irritability, malaise, muscle tension, obsessions, palpitations, panic, restlessness, shortness of breath or suicidal ideas. The severity of symptoms is mild.   Treatment side effects: yawning on ssri.    No problem-specific Assessment & Plan notes found for this encounter.   Past Medical History:  Diagnosis Date  . Gluten free diet    DX with ciliac disease  . Hypothyroidism   . Thyroid disease     Past Surgical History:  Procedure Laterality Date  . ESOPHAGOGASTRODUODENOSCOPY (EGD) WITH PROPOFOL N/A 12/29/2015   Procedure: ESOPHAGOGASTRODUODENOSCOPY (EGD) WITH PROPOFOL;  Surgeon: Christena Deem, MD;  Location: [facility] ENDOSCOPY;  Service: Endoscopy;  Laterality: N/A;    History reviewed. No pertinent family history.  Social History   Social History  . Marital status: Single    Spouse name: N/A  . Number of children: N/A  . Years of education: N/A   Occupational History  . Not on file.   Social History Main Topics  . Smoking status: Never Smoker  . Smokeless tobacco: Never Used  . Alcohol use No  . Drug use: No  . Sexual activity: Yes   Other Topics Concern  . Not on file   Social History Narrative  . No narrative on file    Allergies  Allergen Reactions  . Sulfa  Antibiotics Hives  . Amoxicillin Diarrhea  . Latex Swelling    Outpatient Medications Prior to Visit  Medication Sig Dispense Refill  . ALPRAZolam (XANAX) 0.25 MG tablet Take 1 tablet (0.25 mg total) by mouth 2 (two) times daily as needed for anxiety. 20 tablet 0  . levothyroxine (SYNTHROID, LEVOTHROID) 88 MCG tablet Take 1 tablet (88 mcg total) by mouth daily. 90 tablet 1  . omeprazole (PRILOSEC) 20 MG capsule Take 20 mg by mouth daily.    . busPIRone (BUSPAR) 5 MG tablet Take 1 tablet (5 mg total) by mouth 3 (three) times daily. 60 tablet 1  . norgestimate-ethinyl estradiol (SPRINTEC 28) 0.25-35 MG-MCG tablet Take 1 tablet by mouth daily. 28 tablet 5   No facility-administered medications prior to visit.     Review of Systems  Constitutional: Negative for chills, fever, irritability, malaise/fatigue and weight loss.  HENT: Negative for ear discharge, ear pain and sore throat.   Eyes: Negative for blurred vision.  Respiratory: Negative for cough, sputum production, shortness of breath and wheezing.   Cardiovascular: Negative for chest pain, palpitations and leg swelling.  Gastrointestinal: Positive for nausea. Negative for abdominal pain, blood in stool, constipation, diarrhea, heartburn and melena.  Genitourinary: Negative for dysuria, frequency, hematuria, impotence and urgency.  Musculoskeletal: Negative for back pain, joint pain, myalgias and neck pain.  Skin: Negative for rash.  Neurological: Negative for dizziness, tingling, sensory change, focal weakness and headaches.  Endo/Heme/Allergies: Negative for environmental allergies and  polydipsia. Does not bruise/bleed easily.  Psychiatric/Behavioral: Negative for confusion, decreased concentration, depression and suicidal ideas. The patient is nervous/anxious. The patient does not have insomnia.      Objective  Vitals:   01/24/17 1351  BP: 108/78  Pulse: 80  Resp: 16  Temp: 98.3 F (36.8 C)  SpO2: 98%  Weight: 138 lb 9.6  oz (62.9 kg)  Height:  (1.727 m)    Physical Exam  Constitutional: She is well-developed, well-nourished, and in no distress. No distress.  HENT:  Head: Normocephalic and atraumatic.  Right Ear: External ear normal.  Left Ear: External ear normal.  Nose: Nose normal.  Mouth/Throat: Oropharynx is clear and moist.  Eyes: Conjunctivae and EOM are normal. Pupils are equal, round, and reactive to light. Right eye exhibits no discharge. Left eye exhibits no discharge.  Neck: Normal range of motion. Neck supple. No JVD present. No thyromegaly present.  Cardiovascular: Normal rate, regular rhythm, normal heart sounds and intact distal pulses.  Exam reveals no gallop and no friction rub.   No murmur heard. Pulmonary/Chest: Effort normal and breath sounds normal. She has no wheezes. She has no rales.  Abdominal: Soft. Bowel sounds are normal. She exhibits no mass. There is no tenderness. There is no guarding.  Musculoskeletal: Normal range of motion. She exhibits no edema.  Lymphadenopathy:    She has no cervical adenopathy.  Neurological: She is alert. She has normal reflexes.  Skin: Skin is warm and dry. She is not diaphoretic.  Psychiatric: Mood and affect normal.  Nursing note and vitals reviewed.     Assessment & Plan  Problem List Items Addressed This Visit      Other   Chronic anxiety   Relevant Medications   busPIRone (BUSPAR) 5 MG tablet    Other Visit Diagnoses    Abnormal menses    -  Primary   Relevant Orders   POCT urine pregnancy (Completed)      Meds ordered this encounter  Medications  . busPIRone (BUSPAR) 5 MG tablet    Sig: Take 1 tablet (5 mg total) by mouth 3 (three) times daily.    Dispense:  90 tablet    Refill:  1      Dr. Elizabeth Sauer [facility]  01/24/17

## 2017-02-06 ENCOUNTER — Ambulatory Visit (INDEPENDENT_AMBULATORY_CARE_PROVIDER_SITE_OTHER): Payer: 59 | Admitting: Family Medicine

## 2017-02-06 ENCOUNTER — Encounter: Payer: Self-pay | Admitting: Family Medicine

## 2017-02-06 VITALS — BP 112/80 | HR 100 | Ht 68.0 in | Wt 134.0 lb

## 2017-02-06 DIAGNOSIS — R42 Dizziness and giddiness: Secondary | ICD-10-CM

## 2017-02-06 DIAGNOSIS — E039 Hypothyroidism, unspecified: Secondary | ICD-10-CM

## 2017-02-06 DIAGNOSIS — R55 Syncope and collapse: Secondary | ICD-10-CM

## 2017-02-06 DIAGNOSIS — E860 Dehydration: Secondary | ICD-10-CM

## 2017-02-06 NOTE — Progress Notes (Signed)
Name: Kendra Salinas   MRN: [id number]    DOB: 1995-10-25   Date:02/06/2017       Progress Note  Subjective  Chief Complaint  Chief Complaint  Patient presents with  . Near Syncope    5 days ago- stood up and evrything went black, mouth got dry, sat down and tachy at 120 -got a liter of fluids    Patient had two episodes of nearsyncope wednesday   Near Syncope  This is a new problem. The current episode started in the past 7 days. Associated symptoms include a fever and nausea. Pertinent negatives include no abdominal pain, anorexia, arthralgias, change in bowel habit, chest pain, chills, congestion, coughing, diaphoresis, fatigue, headaches, joint swelling, myalgias, neck pain, numbness, rash, sore throat, swollen glands, urinary symptoms, vertigo, visual change, vomiting or weakness. Nothing aggravates the symptoms. She has tried nothing for the symptoms. The treatment provided mild relief.    No problem-specific Assessment & Plan notes found for this encounter.   Past Medical History:  Diagnosis Date  . Gluten free diet    DX with ciliac disease  . Hypothyroidism   . Thyroid disease     Past Surgical History:  Procedure Laterality Date  . ESOPHAGOGASTRODUODENOSCOPY (EGD) WITH PROPOFOL N/A 12/29/2015   Procedure: ESOPHAGOGASTRODUODENOSCOPY (EGD) WITH PROPOFOL;  Surgeon: Christena Deem, MD;  Location: [facility] ENDOSCOPY;  Service: Endoscopy;  Laterality: N/A;    No family history on file.  Social History   Social History  . Marital status: Single    Spouse name: N/A  . Number of children: N/A  . Years of education: N/A   Occupational History  . Not on file.   Social History Main Topics  . Smoking status: Never Smoker  . Smokeless tobacco: Never Used  . Alcohol use No  . Drug use: No  . Sexual activity: Yes   Other Topics Concern  . Not on file   Social History Narrative  . No narrative on file    Allergies  Allergen Reactions  . Sulfa Antibiotics Hives   . Amoxicillin Diarrhea  . Latex Swelling    Outpatient Medications Prior to Visit  Medication Sig Dispense Refill  . busPIRone (BUSPAR) 5 MG tablet Take 1 tablet (5 mg total) by mouth 3 (three) times daily. (Patient taking differently: Take 5 mg by mouth 2 (two) times daily. ) 90 tablet 1  . hyoscyamine (LEVBID) 0.375 MG 12 hr tablet Take 0.375 mg by mouth daily.     Marland Kitchen levonorgestrel (MIRENA) 20 MCG/24HR IUD 1 each by Intrauterine route once.    Marland Kitchen levothyroxine (SYNTHROID, LEVOTHROID) 88 MCG tablet Take 1 tablet (88 mcg total) by mouth daily. 90 tablet 1  . omeprazole (PRILOSEC) 20 MG capsule Take 20 mg by mouth daily.    Marland Kitchen ALPRAZolam (XANAX) 0.25 MG tablet Take 1 tablet (0.25 mg total) by mouth 2 (two) times daily as needed for anxiety. (Patient not taking: Reported on 02/06/2017) 20 tablet 0   No facility-administered medications prior to visit.     Review of Systems  Constitutional: Positive for fever. Negative for chills, diaphoresis, fatigue, malaise/fatigue and weight loss.  HENT: Negative for congestion, ear discharge, ear pain and sore throat.   Eyes: Negative for blurred vision.  Respiratory: Negative for cough, sputum production, shortness of breath and wheezing.   Cardiovascular: Positive for near-syncope. Negative for chest pain, palpitations and leg swelling.  Gastrointestinal: Positive for nausea. Negative for abdominal pain, anorexia, blood in stool, change in bowel  habit, constipation, diarrhea, heartburn, melena and vomiting.  Genitourinary: Negative for dysuria, frequency, hematuria and urgency.  Musculoskeletal: Negative for arthralgias, back pain, joint pain, joint swelling, myalgias and neck pain.  Skin: Negative for rash.  Neurological: Negative for dizziness, vertigo, tingling, sensory change, focal weakness, weakness, numbness and headaches.  Endo/Heme/Allergies: Negative for environmental allergies and polydipsia. Does not bruise/bleed easily.   Psychiatric/Behavioral: Negative for depression and suicidal ideas. The patient is not nervous/anxious and does not have insomnia.      Objective  Vitals:   02/06/17 1557 02/06/17 1608 02/06/17 1609 02/06/17 1610  BP: 100/80 110/80 112/80 112/80  Pulse: 80 80 100 100  Weight: 134 lb (60.8 kg)     Height:  (1.727 m)       Physical Exam  Constitutional: She is well-developed, well-nourished, and in no distress. No distress.  HENT:  Head: Normocephalic and atraumatic.  Right Ear: External ear normal.  Left Ear: External ear normal.  Nose: Nose normal.  Mouth/Throat: Oropharynx is clear and moist.  Eyes: Conjunctivae and EOM are normal. Pupils are equal, round, and reactive to light. Right eye exhibits no discharge. Left eye exhibits no discharge.  Neck: Normal range of motion. Neck supple. No JVD present. No thyromegaly present.  Cardiovascular: Normal rate, regular rhythm, normal heart sounds and intact distal pulses.  Exam reveals no gallop and no friction rub.   No murmur heard. Pulmonary/Chest: Effort normal and breath sounds normal. She has no wheezes. She has no rales.  Abdominal: Soft. Bowel sounds are normal. She exhibits no mass. There is no tenderness. There is no guarding.  Musculoskeletal: Normal range of motion. She exhibits no edema.  Lymphadenopathy:    She has no cervical adenopathy.  Neurological: She is alert. She has normal reflexes.  Skin: Skin is warm and dry. She is not diaphoretic.  Psychiatric: Mood and affect normal.  Nursing note and vitals reviewed.     Assessment & Plan  Problem List Items Addressed This Visit      Endocrine   Adult hypothyroidism   Relevant Orders   TSH    Other Visit Diagnoses    Postural dizziness with near syncope    -  Primary   Relevant Orders   EKG 12-Lead (Completed)   Renal Function Panel   CBC with Differential/Platelet   TSH   Dehydration       Relevant Orders   Renal Function Panel   CBC with  Differential/Platelet      No orders of the defined types were placed in this encounter.     Dr. Hayden [facility]  02/06/17

## 2017-02-07 LAB — CBC WITH DIFFERENTIAL/PLATELET
Basophils Absolute: 0.1 10*3/uL (ref 0.0–0.2)
Basos: 1 %
EOS (ABSOLUTE): 0.3 10*3/uL (ref 0.0–0.4)
EOS: 3 %
HEMATOCRIT: 40.9 % (ref 34.0–46.6)
HEMOGLOBIN: 13.3 g/dL (ref 11.1–15.9)
IMMATURE GRANS (ABS): 0 10*3/uL (ref 0.0–0.1)
IMMATURE GRANULOCYTES: 0 %
LYMPHS: 39 %
Lymphocytes Absolute: 2.9 10*3/uL (ref 0.7–3.1)
MCH: 28.5 pg (ref 26.6–33.0)
MCHC: 32.5 g/dL (ref 31.5–35.7)
MCV: 88 fL (ref 79–97)
Monocytes Absolute: 0.5 10*3/uL (ref 0.1–0.9)
Monocytes: 7 %
NEUTROS PCT: 50 %
Neutrophils Absolute: 3.9 10*3/uL (ref 1.4–7.0)
PLATELETS: 289 10*3/uL (ref 150–379)
RBC: 4.67 x10E6/uL (ref 3.77–5.28)
RDW: 13 % (ref 12.3–15.4)
WBC: 7.6 10*3/uL (ref 3.4–10.8)

## 2017-02-07 LAB — RENAL FUNCTION PANEL
ALBUMIN: 4.9 g/dL (ref 3.5–5.5)
BUN/Creatinine Ratio: 12 (ref 9–23)
BUN: 11 mg/dL (ref 6–20)
CO2: 24 mmol/L (ref 18–29)
CREATININE: 0.89 mg/dL (ref 0.57–1.00)
Calcium: 10.1 mg/dL (ref 8.7–10.2)
Chloride: 102 mmol/L (ref 96–106)
GFR, EST AFRICAN AMERICAN: 106 mL/min/{1.73_m2} (ref >59)
GFR, EST NON AFRICAN AMERICAN: 92 mL/min/{1.73_m2} (ref >59)
GLUCOSE: 79 mg/dL (ref 65–99)
POTASSIUM: 4.6 mmol/L (ref 3.5–5.2)
Phosphorus: 3.6 mg/dL (ref 2.5–4.5)
Sodium: 141 mmol/L (ref 134–144)

## 2017-02-07 LAB — TSH: TSH: 1.86 u[IU]/mL (ref 0.450–4.500)

## 2017-02-10 ENCOUNTER — Other Ambulatory Visit
Admission: RE | Admit: 2017-02-10 | Discharge: 2017-02-10 | Disposition: A | Discharge status: Home / Self Care | Payer: 59 | Source: Ambulatory Visit | Attending: Gastroenterology | Admitting: Gastroenterology

## 2017-02-10 DIAGNOSIS — K900 Celiac disease: Secondary | ICD-10-CM | POA: Insufficient documentation

## 2017-02-11 LAB — IGG, IGA, IGM
IgA: 60 mg/dL — ABNORMAL LOW (ref 87–352)
IgG (Immunoglobin G), Serum: 876 mg/dL (ref 700–1600)
IgM, Serum: 52 mg/dL (ref 26–217)

## 2017-02-15 LAB — IGE: IGE (IMMUNOGLOBULIN E), SERUM: 31 [IU]/mL (ref 0–100)

## 2017-02-16 LAB — MISCELLANEOUS TEST

## 2017-03-09 ENCOUNTER — Ambulatory Visit: Payer: Managed Care, Other (non HMO) | Admitting: Family Medicine

## 2017-03-28 ENCOUNTER — Other Ambulatory Visit: Payer: Self-pay

## 2017-03-29 ENCOUNTER — Other Ambulatory Visit
Admission: RE | Admit: 2017-03-29 | Discharge: 2017-03-29 | Disposition: A | Discharge status: Home / Self Care | Payer: 59 | Source: Ambulatory Visit | Attending: Gastroenterology | Admitting: Gastroenterology

## 2017-03-29 DIAGNOSIS — R197 Diarrhea, unspecified: Secondary | ICD-10-CM | POA: Diagnosis present

## 2017-03-29 LAB — GASTROINTESTINAL PANEL BY PCR, STOOL (REPLACES STOOL CULTURE)
Adenovirus F40/41: NOT DETECTED
Astrovirus: NOT DETECTED
CRYPTOSPORIDIUM: NOT DETECTED
Campylobacter species: NOT DETECTED
Cyclospora cayetanensis: NOT DETECTED
ENTAMOEBA HISTOLYTICA: NOT DETECTED
ENTEROAGGREGATIVE E COLI (EAEC): NOT DETECTED
Enteropathogenic E coli (EPEC): NOT DETECTED
Enterotoxigenic E coli (ETEC): NOT DETECTED
GIARDIA LAMBLIA: NOT DETECTED
Norovirus GI/GII: NOT DETECTED
Plesimonas shigelloides: NOT DETECTED
ROTAVIRUS A: NOT DETECTED
SAPOVIRUS (I, II, IV, AND V): NOT DETECTED
SHIGA LIKE TOXIN PRODUCING E COLI (STEC): NOT DETECTED
SHIGELLA/ENTEROINVASIVE E COLI (EIEC): NOT DETECTED
Salmonella species: NOT DETECTED
Vibrio cholerae: NOT DETECTED
Vibrio species: NOT DETECTED
YERSINIA ENTEROCOLITICA: NOT DETECTED

## 2017-03-29 LAB — C DIFFICILE QUICK SCREEN W PCR REFLEX
C Diff antigen: NEGATIVE
C Diff interpretation: NOT DETECTED
C Diff toxin: NEGATIVE

## 2017-08-24 ENCOUNTER — Ambulatory Visit: Payer: 59 | Admitting: Family Medicine

## 2017-08-24 ENCOUNTER — Encounter: Payer: Self-pay | Admitting: Family Medicine

## 2017-08-24 VITALS — BP 124/70 | HR 88 | Ht 68.0 in | Wt 141.0 lb

## 2017-08-24 DIAGNOSIS — F514 Sleep terrors [night terrors]: Secondary | ICD-10-CM

## 2017-08-24 DIAGNOSIS — F513 Sleepwalking [somnambulism]: Secondary | ICD-10-CM | POA: Diagnosis not present

## 2017-08-24 NOTE — Progress Notes (Signed)
Name: Kendra Salinas   MRN: [id number]    DOB: [birth year]   Date:08/24/2017       Progress Note  Subjective  Chief Complaint  Chief Complaint  Patient presents with  . night terror    having night terrors and sleep walking- trying to leave the house/ witnessed by fiance- x 2 months that it has gotten this bad    Insomnia  Primary symptoms: no fragmented sleep, no sleep disturbance, no difficulty falling asleep, no somnolence, no frequent awakening, no premature morning awakening, no malaise/fatigue, no napping.   The current episode started more than one year. The problem occurs intermittently. The problem has been waxing and waning since onset. The symptoms are aggravated by anxiety (buspirone). How many beverages per day that contain caffeine: 0 - 1.  Past treatments include nothing. Typical bedtime:  11-12 P.M..  How long after going to bed to you fall asleep: less than 15 minutes.   PMH includes: no hypertension, no depression, no restless leg syndrome, no apnea.    No problem-specific Assessment & Plan notes found for this encounter.   Past Medical History:  Diagnosis Date  . Gluten free diet    DX with ciliac disease  . Hypothyroidism   . Thyroid disease     History reviewed. No pertinent surgical history.  History reviewed. No pertinent family history.  Social History   Socioeconomic History  . Marital status: Single    Spouse name: Not on file  . Number of children: Not on file  . Years of education: Not on file  . Highest education level: Not on file  Social Needs  . Financial resource strain: Not on file  . Food insecurity - worry: Not on file  . Food insecurity - inability: Not on file  . Transportation needs - medical: Not on file  . Transportation needs - non-medical: Not on file  Occupational History  . Not on file  Tobacco Use  . Smoking status: Never Smoker  . Smokeless tobacco: Never Used  Substance and Sexual Activity  . Alcohol use: No   Alcohol/week: 0.0 oz  . Drug use: No  . Sexual activity: Yes  Other Topics Concern  . Not on file  Social History Narrative  . Not on file    Allergies  Allergen Reactions  . Sulfa Antibiotics Hives  . Amoxicillin Diarrhea  . Latex Swelling    Outpatient Medications Prior to Visit  Medication Sig Dispense Refill  . ALPRAZolam (XANAX) 0.25 MG tablet Take 1 tablet (0.25 mg total) by mouth 2 (two) times daily as needed for anxiety. 20 tablet 0  . budesonide (ENTOCORT EC) 3 MG 24 hr capsule Take 3 mg daily by mouth.    . levonorgestrel (MIRENA) 20 MCG/24HR IUD 1 each by Intrauterine route once.    Marland Kitchen. levothyroxine (SYNTHROID, LEVOTHROID) 88 MCG tablet Take 1 tablet (88 mcg total) by mouth daily. 90 tablet 1  . busPIRone (BUSPAR) 5 MG tablet Take 1 tablet (5 mg total) by mouth 3 (three) times daily. (Patient taking differently: Take 5 mg by mouth 2 (two) times daily. ) 90 tablet 1  . omeprazole (PRILOSEC) 20 MG capsule Take 20 mg by mouth daily.    . hyoscyamine (LEVBID) 0.375 MG 12 hr tablet Take 0.375 mg by mouth daily.      No facility-administered medications prior to visit.     Review of Systems  Constitutional: Negative for chills, fever, malaise/fatigue and weight loss.  HENT: Negative for  ear discharge, ear pain and sore throat.   Eyes: Negative for blurred vision.  Respiratory: Negative for apnea, cough, sputum production, shortness of breath and wheezing.   Cardiovascular: Negative for chest pain, palpitations and leg swelling.  Gastrointestinal: Negative for abdominal pain, blood in stool, constipation, diarrhea, heartburn, melena and nausea.  Genitourinary: Negative for dysuria, frequency, hematuria and urgency.  Musculoskeletal: Negative for back pain, joint pain, myalgias and neck pain.  Skin: Negative for rash.  Neurological: Negative for dizziness, tingling, sensory change, focal weakness and headaches.  Endo/Heme/Allergies: Negative for environmental allergies and  polydipsia. Does not bruise/bleed easily.  Psychiatric/Behavioral: Negative for depression, sleep disturbance and suicidal ideas. The patient has insomnia. The patient is not nervous/anxious.      Objective  Vitals:   08/24/17 0928  BP: 124/70  Pulse: 88  Weight: 141 lb (64 kg)  Height: 5\' 8"  (1.727 m)    Physical Exam  Constitutional: She is well-developed, well-nourished, and in no distress. No distress.  HENT:  Head: Normocephalic and atraumatic.  Right Ear: External ear normal.  Left Ear: External ear normal.  Nose: Nose normal.  Mouth/Throat: Oropharynx is clear and moist.  Eyes: Conjunctivae and EOM are normal. Pupils are equal, round, and reactive to light. Right eye exhibits no discharge. Left eye exhibits no discharge.  Neck: Normal range of motion. Neck supple. No JVD present. No thyromegaly present.  Cardiovascular: Normal rate, regular rhythm, normal heart sounds and intact distal pulses. Exam reveals no gallop and no friction rub.  No murmur heard. Pulmonary/Chest: Effort normal and breath sounds normal. She has no wheezes. She has no rales.  Abdominal: Soft. Bowel sounds are normal. She exhibits no mass. There is no tenderness. There is no guarding.  Musculoskeletal: Normal range of motion. She exhibits no edema.  Lymphadenopathy:    She has no cervical adenopathy.  Neurological: She is alert.  Skin: Skin is warm and dry. She is not diaphoretic.  Psychiatric: Mood and affect normal.  Nursing note and vitals reviewed.     Assessment & Plan  Problem List Items Addressed This Visit    None    Visit Diagnoses    Night terrors, adult    -  Primary   info givem Aundria MemsSusan Snider   Sleepwalking         [place] info [place] therapist [place] No orders of the defined types were placed in this encounter.     Dr. Hayden Rasmusseneanna Jones [facility]  08/24/17

## 2017-09-01 ENCOUNTER — Other Ambulatory Visit: Payer: Self-pay | Admitting: Family Medicine

## 2017-09-01 DIAGNOSIS — E039 Hypothyroidism, unspecified: Secondary | ICD-10-CM

## 2017-11-13 ENCOUNTER — Ambulatory Visit: Payer: 59 | Admitting: Family Medicine

## 2017-11-13 ENCOUNTER — Encounter: Payer: Self-pay | Admitting: Family Medicine

## 2017-11-13 VITALS — BP 120/70 | HR 80 | Ht 68.0 in | Wt 141.0 lb

## 2017-11-13 DIAGNOSIS — M24851 Other specific joint derangements of right hip, not elsewhere classified: Secondary | ICD-10-CM | POA: Diagnosis not present

## 2017-11-13 DIAGNOSIS — G8929 Other chronic pain: Secondary | ICD-10-CM

## 2017-11-13 DIAGNOSIS — M461 Sacroiliitis, not elsewhere classified: Secondary | ICD-10-CM

## 2017-11-13 DIAGNOSIS — M7061 Trochanteric bursitis, right hip: Secondary | ICD-10-CM

## 2017-11-13 DIAGNOSIS — M5441 Lumbago with sciatica, right side: Secondary | ICD-10-CM

## 2017-11-13 MED ORDER — TRAMADOL HCL 50 MG PO TABS: 50.0000 mg | ORAL_TABLET | Freq: Four times a day (QID) | ORAL | 0 refills | Status: AC

## 2017-11-13 NOTE — Progress Notes (Signed)
Name: Kendra Salinas   MRN: [id number]    DOB: June 06, 1996   Date:11/13/2017       Progress Note  Subjective  Chief Complaint  Chief Complaint  Patient presents with  . Hip Pain    R) hip pain x 3 months- got worse x past 3 days. Has to keep pressure off it. Driving makes it worse    Hip Pain   The incident occurred more than 1 week ago (oct/nov). There was no injury mechanism. The pain is present in the right hip. The pain is at a severity of 8/10 (baseline 3-4). The pain is moderate. The pain has been constant since onset. Associated symptoms include a loss of motion. Pertinent negatives include no inability to bear weight, loss of sensation, muscle weakness, numbness or tingling. The symptoms are aggravated by weight bearing and movement. She has tried acetaminophen for the symptoms. The treatment provided mild relief.    No problem-specific Assessment & Plan notes found for this encounter.   Past Medical History:  Diagnosis Date  . Gluten free diet    DX with ciliac disease  . Hypothyroidism   . Thyroid disease     Past Surgical History:  Procedure Laterality Date  . ESOPHAGOGASTRODUODENOSCOPY (EGD) WITH PROPOFOL N/A 12/29/2015   Procedure: ESOPHAGOGASTRODUODENOSCOPY (EGD) WITH PROPOFOL;  Surgeon: Christena DeemMartin U Skulskie, MD;  Location[facility] ENDOSCOPY;  Service: Endoscopy;  Laterality: N/A;    No family history on file.  Social History   Socioeconomic History  . Marital status: Single    Spouse name: Not on file  . Number of children: Not on file  . Years of education: Not on file  . Highest education level: Not on file  Social Needs  . Financial resource strain: Not on file  . Food insecurity - worry: Not on file  . Food insecurity - inability: Not on file  . Transportation needs - medical: Not on file  . Transportation needs - non-medical: Not on file  Occupational History  . Not on file  Tobacco Use  . Smoking status: Never Smoker  . Smokeless tobacco: Never Used   Substance and Sexual Activity  . Alcohol use: No    Alcohol/week: 0.0 oz  . Drug use: No  . Sexual activity: Yes  Other Topics Concern  . Not on file  Social History Narrative  . Not on file    Allergies  Allergen Reactions  . Sulfa Antibiotics Hives  . Amoxicillin Diarrhea  . Latex Swelling    Outpatient Medications Prior to Visit  Medication Sig Dispense Refill  . ALPRAZolam (XANAX) 0.25 MG tablet Take 1 tablet (0.25 mg total) by mouth 2 (two) times daily as needed for anxiety. 20 tablet 0  . levonorgestrel (MIRENA) 20 MCG/24HR IUD 1 each by Intrauterine route once.    Marland Kitchen. levothyroxine (SYNTHROID, LEVOTHROID) 88 MCG tablet TAKE 1 TABLET BY MOUTH EVERY DAY 90 tablet 0  . budesonide (ENTOCORT EC) 3 MG 24 hr capsule Take 3 mg daily by mouth.    Marland Kitchen. omeprazole (PRILOSEC) 20 MG capsule Take 20 mg by mouth daily.     No facility-administered medications prior to visit.     Review of Systems  Constitutional: Negative for chills, fever, malaise/fatigue and weight loss.  HENT: Negative for ear discharge, ear pain and sore throat.   Eyes: Negative for blurred vision.  Respiratory: Negative for cough, sputum production, shortness of breath and wheezing.   Cardiovascular: Negative for chest pain, palpitations and leg swelling.  Gastrointestinal: Negative for abdominal pain, blood in stool, constipation, diarrhea, heartburn, melena and nausea.  Genitourinary: Negative for dysuria, frequency, hematuria and urgency.  Musculoskeletal: Negative for back pain, joint pain, myalgias and neck pain.  Skin: Negative for rash.  Neurological: Negative for dizziness, tingling, sensory change, focal weakness, numbness and headaches.  Endo/Heme/Allergies: Negative for environmental allergies and polydipsia. Does not bruise/bleed easily.  Psychiatric/Behavioral: Negative for depression and suicidal ideas. The patient is not nervous/anxious and does not have insomnia.      Objective  Vitals:    11/13/17 1419  BP: 120/70  Pulse: 80  Weight: 141 lb (64 kg)  Height: 5\' 8"  (1.727 m)    Physical Exam  Constitutional: She is well-developed, well-nourished, and in no distress. No distress.  HENT:  Head: Normocephalic and atraumatic.  Right Ear: External ear normal.  Left Ear: External ear normal.  Nose: Nose normal.  Mouth/Throat: Oropharynx is clear and moist.  Eyes: Conjunctivae and EOM are normal. Pupils are equal, round, and reactive to light. Right eye exhibits no discharge. Left eye exhibits no discharge.  Neck: Normal range of motion. Neck supple. No JVD present. No thyromegaly present.  Cardiovascular: Normal rate, regular rhythm, normal heart sounds and intact distal pulses. Exam reveals no gallop and no friction rub.  No murmur heard. Pulmonary/Chest: Effort normal and breath sounds normal.  Abdominal: Soft. Bowel sounds are normal. She exhibits no mass. There is no tenderness. There is no guarding.  Musculoskeletal: Normal range of motion. She exhibits no edema.       Right hip: She exhibits tenderness.  trochanteric bursa/sartorius/right iliosacral joint  Lymphadenopathy:    She has no cervical adenopathy.  Neurological: She is alert. She has normal reflexes.  Skin: Skin is warm and dry. She is not diaphoretic.  Psychiatric: Mood and affect normal.  Nursing note and vitals reviewed.     Assessment & Plan  Problem List Items Addressed This Visit    None    Visit Diagnoses    Right snapping hip    -  Primary   Relevant Medications   traMADol (ULTRAM) 50 MG tablet   Other Relevant Orders   Ambulatory referral to Orthopedic Surgery   Trochanteric bursitis of right hip       Relevant Medications   traMADol (ULTRAM) 50 MG tablet   Other Relevant Orders   Ambulatory referral to Orthopedic Surgery   Sacroiliitis (HCC)       Relevant Medications   traMADol (ULTRAM) 50 MG tablet   Other Relevant Orders   Ambulatory referral to Orthopedic Surgery   Chronic  right-sided low back pain with right-sided sciatica       Relevant Medications   traMADol (ULTRAM) 50 MG tablet      Meds ordered this encounter  Medications  . traMADol (ULTRAM) 50 MG tablet    Sig: Take 1 tablet (50 mg total) by mouth 4 (four) times daily.    Dispense:  30 tablet    Refill:  0      Dr. Hayden [facility]  11/13/17

## 2017-11-13 NOTE — Patient Instructions (Signed)
Snapping Hip Syndrome Snapping hip syndrome is a condition that causes a "snapping" or "popping" feeling in your hip, especially when you walk, stand up from a chair, or swing your leg. Strong bands of tissue (tendons) attach the muscles in your buttocks, thighs, and pelvis to the bones of your hip. Snapping hip syndrome typically happens when a muscle or tendon moves across a bony part of your hip. Snapping hip can also involve torn or loose structures within the joint. This is less common. Snapping hip syndrome can affect different areas of your hip, including the front, side, or back. What are the causes? This condition is typically caused by tight tendons or muscles around the hip. This often happens from overuse. What increases the risk? The following factors may make you more likely to develop this condition:  Being younger than age 30.  Being a Tourist information centre manager, runner, weight lifter, gymnast, or soccer player.  Having had an injury to your hip or pelvis.  Having an abnormally shaped pelvis or leg (deformity).  What are the signs or symptoms? Symptoms of this condition include:  A "snapping" or "popping" sensation in the front, side, or back of the hip when moving your leg. This can cause pain. The pain typically goes away when you stop moving.  Tightness in the hip.  Swelling in the front or side of the hip.  Leg weakness, especially when trying to lift it up or sideways.  Difficulty getting out of low chairs.  How is this diagnosed? This condition is diagnosed based on your symptoms, medical history, and physical exam. Your health care provider may have you move your leg into certain positions and test your muscle strength. You may have an X-ray, ultrasound, or MRI to rule out other causes of pain. You may also have an injection of numbing medicine to see if that reduces your symptoms. How is this treated? Treatment for this condition includes:  Stopping all activities that cause  pain or make your condition worse.  Icing your hip to relieve pain.  Taking NSAIDs or getting corticosteroid injections to reduce pain and swelling.  Doing range-of-motion and strengthening exercises (physical therapy) as told by your health care provider.  You may need surgery to loosen your muscle and tendon or to remove any loose pieces of cartilage if other treatments do not work. Follow these instructions at home:  Take over-the-counter and prescription medicines only as told by your health care provider.  If directed, apply ice to the injured area. ? Put ice in a plastic bag. ? Place a towel between your skin and the bag. ? Leave the ice on for 20 minutes, 2-3 times a day.  Do physical therapy as told by your health care provider.  Return to your normal activities as told by your health care provider. Ask your health care provider what activities are safe for you.  Keep all follow-up visits as told by your health care provider. This is important. How is this prevented?  Warm up and stretch before being active.  Cool down and stretch after being active.  Give your body time to rest between periods of activity.  Maintain physical fitness, including: ? Strength. ? Flexibility. Contact a health care provider if:  You have pain, swelling, and difficulty moving that gets worse.  Your leg or hip feels weak and feels like it cannot hold you up.  You cannot stand or walk without severe pain. This information is not intended to replace advice given to  you by your health care provider. Make sure you discuss any questions you have with your health care provider. Document Released: 10/03/2005 Document Revised: 06/07/2016 Document Reviewed: 09/15/2015 Elsevier Interactive Patient Education  2018 [employer] Snapping Hip Syndrome Rehab Ask your health care provider which exercises are safe for you. Do exercises exactly as told by your health care provider and adjust them as  directed. It is normal to feel mild stretching, pulling, tightness, or discomfort as you do these exercises, but you should stop right away if you feel sudden pain or your pain gets worse. Do not begin these exercises until told by your health care provider. Stretching and range of motion exercises These exercises warm up your muscles and joints and improve the movement and flexibility of your hip and pelvis. These exercises also help to relieve pain and stiffness. Exercise A: Hip rotators  1. Lie on your back on a firm surface. 2. Pull your left / right knee toward your same shoulder with your left / right hand until your knee is pointing toward the ceiling. Hold your left / right ankle with your other hand. 3. Keeping your knee steady, gently pull your ankle toward your other shoulder until you feel a stretch in your buttocks. 4. Hold this position for __________ seconds. 5. Slowly return to the starting position. Repeat __________ times. Complete this stretch __________ times a day. Exercise B: Iliotibial band  1. Lie on your side with your left / right leg in the top position. 2. Bend your left / right knee and grab your ankle. 3. Slowly bring your knee back so your thigh is behind your body. 4. Slowly lower your knee toward the floor until you feel a gentle stretch on the outside of your left / right thigh. If you do not feel a stretch and your knee will not fall farther, place the heel of your other foot on top of your outer knee and pull your thigh down farther. 5. Hold this position for __________ seconds. 6. Slowly return to the starting position. Repeat __________ times. Complete this stretch __________ times a day. Exercise C: Lunge ( hip flexors) 1. Kneel on the floor on your left / right knee. Bend your other knee so it is directly over your ankle. 2. Keep good posture with your head over your shoulders. Tuck your tailbone underneath you. This will prevent your back from arching  too much. 3. You should feel a gentle stretch in the front of your thigh or hip. If you do not feel a stretch, slowly lunge forward with your chest up. 4. Hold this position for __________ seconds. Repeat __________ times. Complete this exercise __________ times a day. Strengthening exercises These exercises build strength and endurance in your hip and pelvis. Endurance is the ability to use your muscles for a long time, even after they get tired. Exercise D: Straight leg raises ( hip abductors) 1. Lie on your side so your left / right leg is in the top position. Lie so your head, shoulder, knee, and hip line up. Bend your bottom knee to help you balance. 2. Lift your top leg up 4-6 inches (10-15 cm), keeping your toes pointed straight ahead. 3. Hold this position for __________ seconds. 4. Slowly lower your leg to the starting position. Let your muscles relax completely. Repeat __________ times. Complete this exercise __________ times a day. Exercise E: Hip abductors and rotators, quadruped  1. Get on your hands and knees on a firm, lightly  padded surface. Your hands should be directly below your shoulders, and your knees should be directly below your hips. 2. Lift your left / right knee out to the side. Keep your knee bent. Do not twist your body. 3. Hold this position for __________ seconds. 4. Slowly lower your leg. Repeat __________ times. Complete this exercise __________ times a day. This information is not intended to replace advice given to you by your health care provider. Make sure you discuss any questions you have with your health care provider. Document Released: 10/03/2005 Document Revised: 06/07/2016 Document Reviewed: 09/15/2015 Elsevier Interactive Patient Education  [employer]2018 [employer]

## 2017-11-27 IMAGING — RF DG UGI W/ SMALL BOWEL HIGH DENSITY
15 of 23 series · 15 of 23 positions shown · non-contrast
Comparison: CT 08/30/2016.

CLINICAL DATA: Right lower quadrant pain.  Diarrhea.

EXAM:
UPPER GI SERIES WITH SMALL BOWEL FOLLOW-THROUGH
FLUOROSCOPY TIME:  Fluoroscopy Time:  2 minutes 24 seconds
Radiation Exposure Index (if provided by the fluoroscopic device):
93.6 mGy
Number of Acquired Spot Images: 24
TECHNIQUE: Combined double contrast and single contrast upper GI series using
effervescent crystals, thick barium, and thin barium. Subsequently,
serial images of the small bowel were obtained including spot views
of the terminal ileum.

[Series 1: t abdomen supine · 0.15mm/px · 1 of 1 slices shown]
[im 1/1]
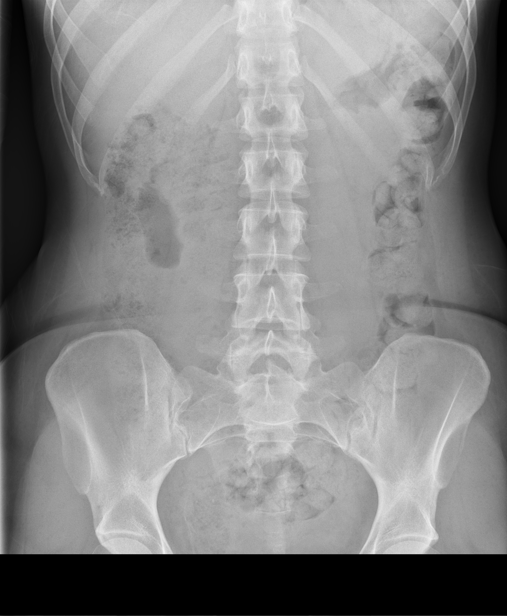

[Series 3: fluoro_barium 2fps_bw · 0.18mm/px · 1 of 1 slices shown (1 of 11)]
[im 1/1]
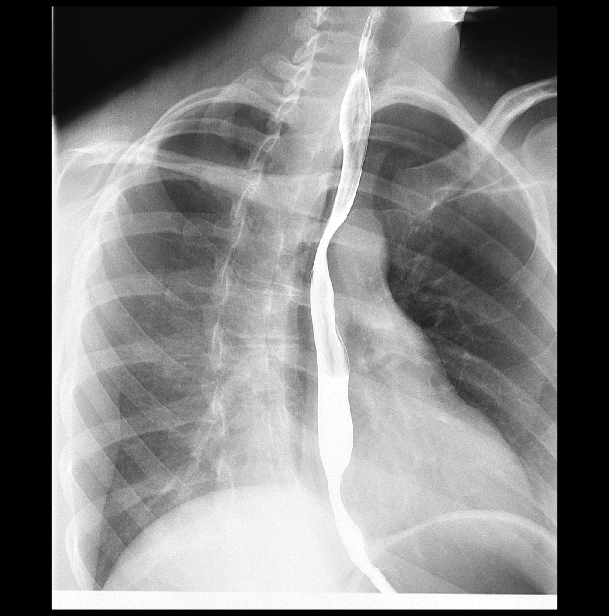

[Series 4: fluoro_barium 2fps_bw · 0.18mm/px · 1 of 1 slices shown (2 of 11)]
[im 1/1]
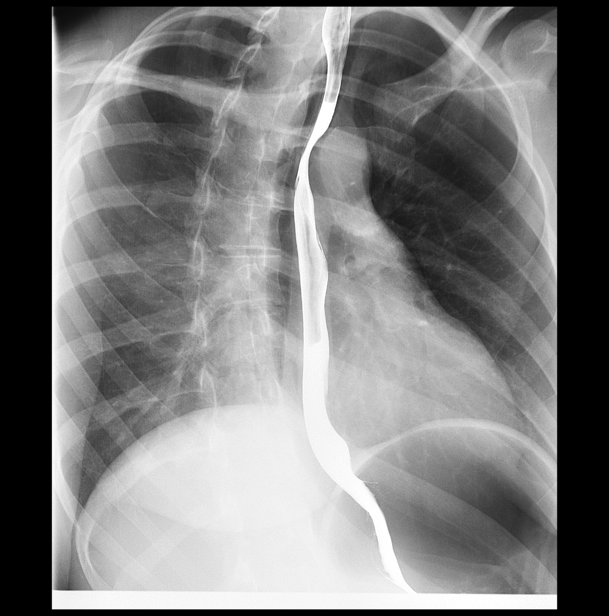

[Series 6: fluoro_barium 2fps_bw · 0.19mm/px · 1 of 1 slices shown (3 of 11)]
[im 1/1]
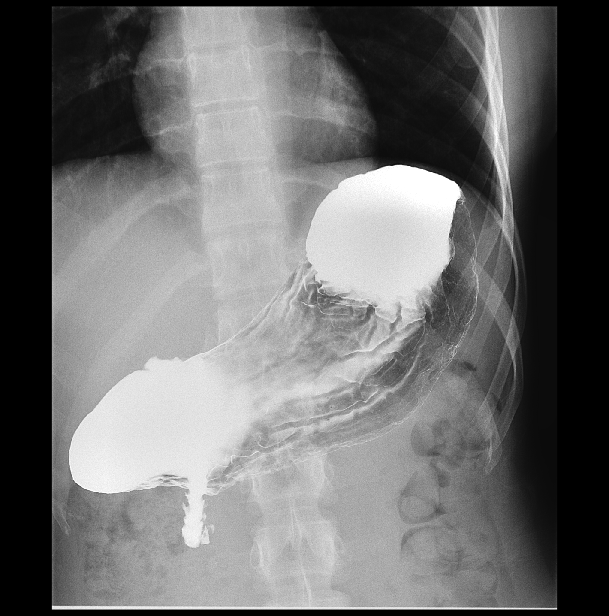

[Series 7: fluoro_barium 2fps_bw · 0.19mm/px · 1 of 1 slices shown (4 of 11)]
[im 1/1]
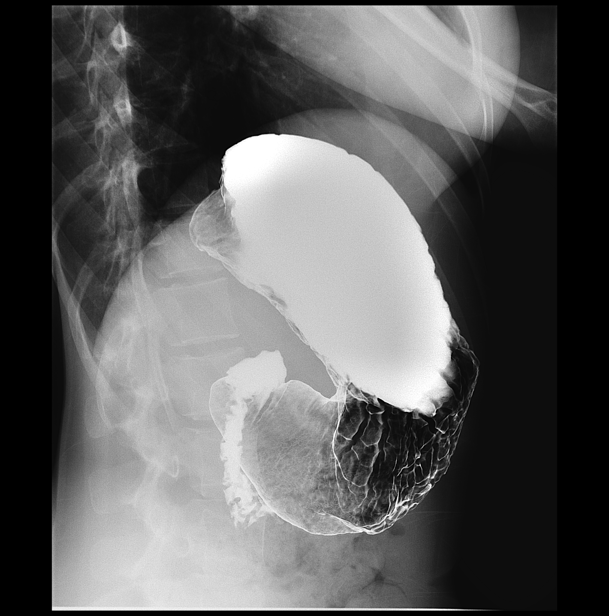

[Series 9: fluoro_barium 2fps_bw · 0.19mm/px · 1 of 1 slices shown (5 of 11)]
[im 1/1]
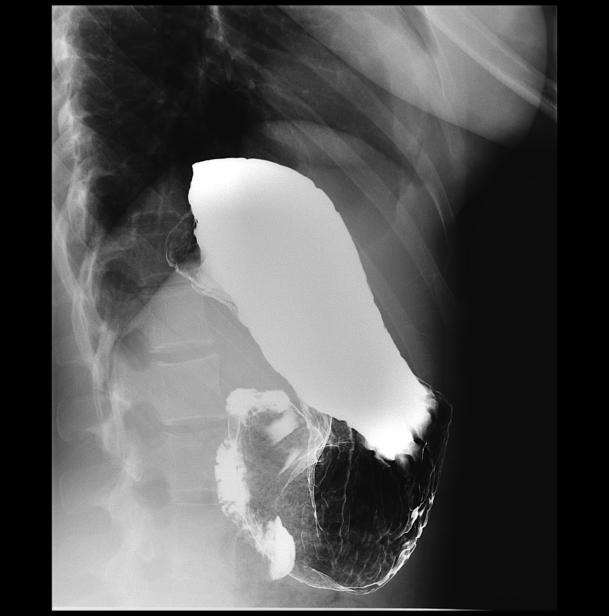

[Series 10: fluoro_barium 2fps_bw · 0.19mm/px · 1 of 1 slices shown (6 of 11)]
[im 1/1]
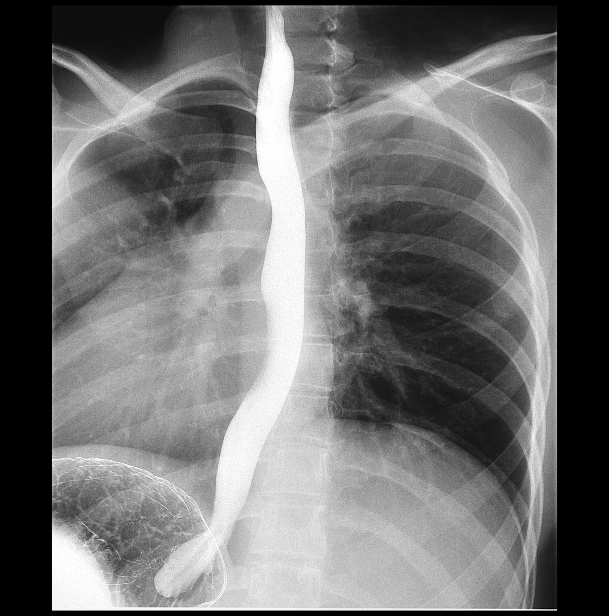

[Series 12: t abdomen barium ap · 0.15mm/px · 1 of 1 slices shown (1 of 3)]
[im 1/1]
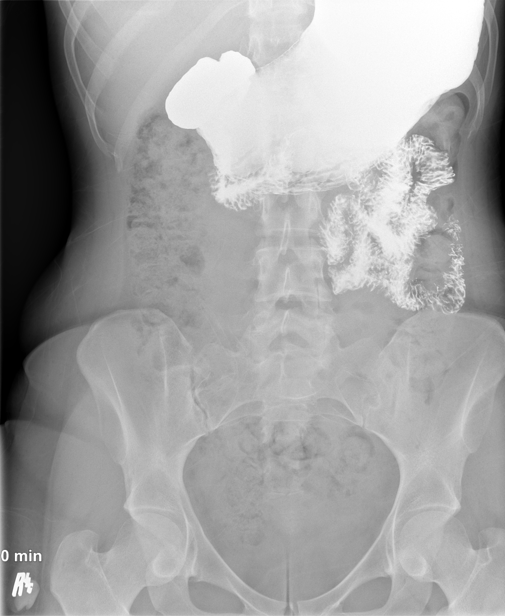

[Series 14: t abdomen barium ap · 0.15mm/px · 1 of 1 slices shown (2 of 3)]
[im 1/1]
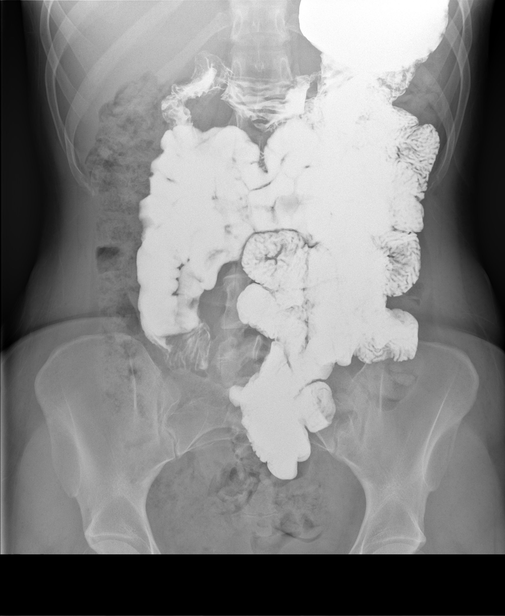

[Series 15: t abdomen barium ap · 0.15mm/px · 1 of 1 slices shown (3 of 3)]
[im 1/1]
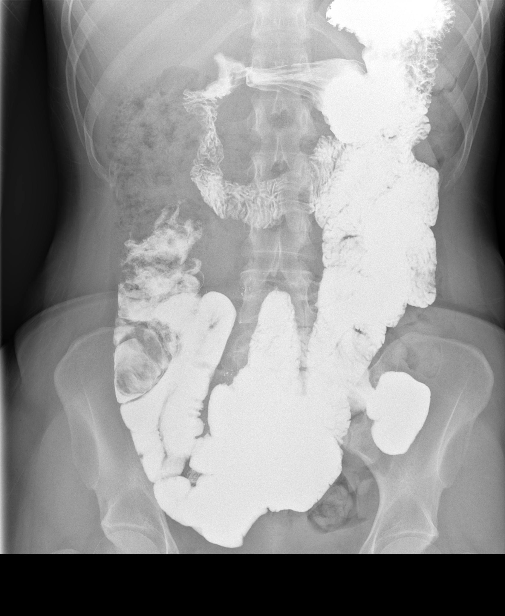

[Series 17: fluoro_barium 2fps_bw · 0.18mm/px · 1 of 1 slices shown (7 of 11)]
[im 1/1]
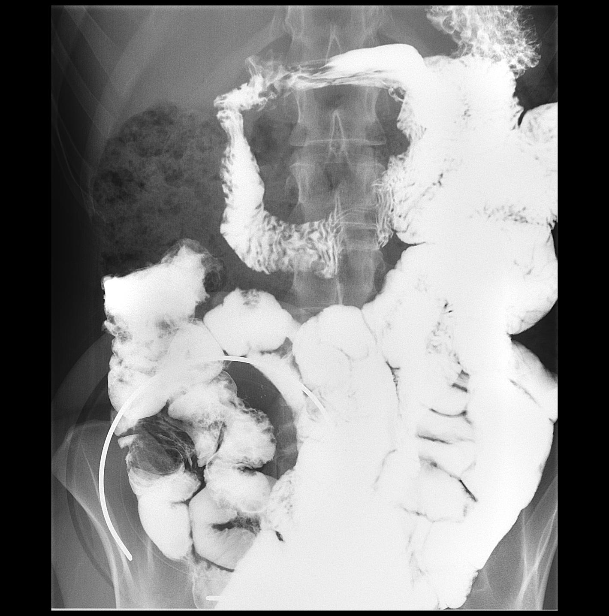

[Series 18: fluoro_barium 2fps_bw · 0.18mm/px · 1 of 1 slices shown (8 of 11)]
[im 1/1]
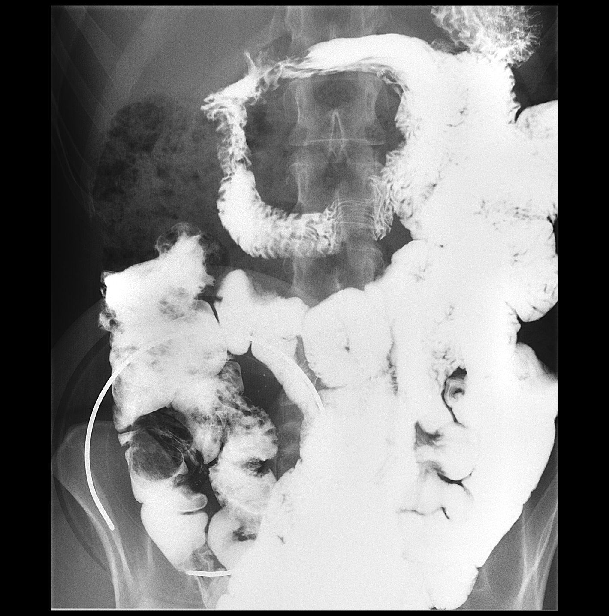

[Series 20: fluoro_barium 2fps_bw · 0.18mm/px · 1 of 1 slices shown (9 of 11)]
[im 1/1]
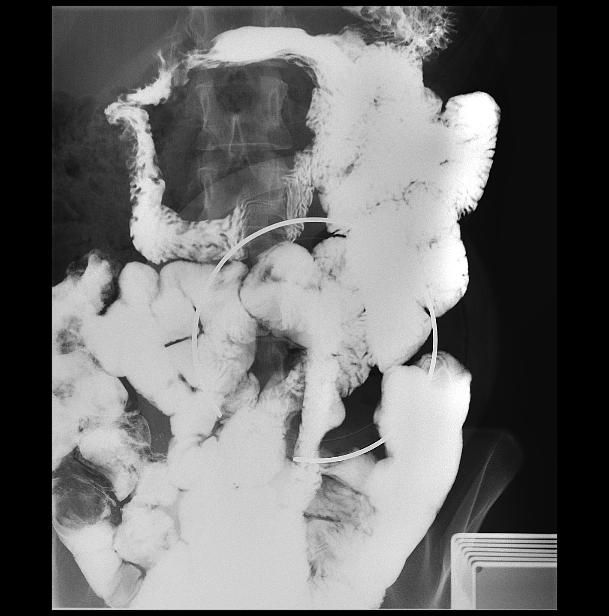

[Series 21: fluoro_barium 2fps_bw · 0.18mm/px · 1 of 1 slices shown (10 of 11)]
[im 1/1]
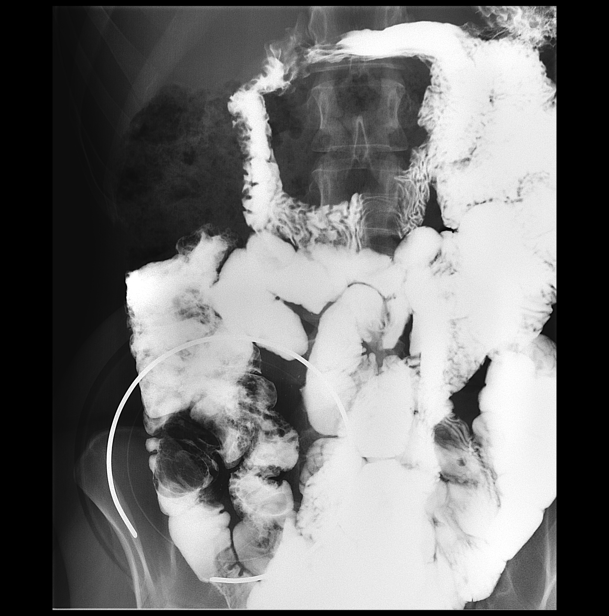

[Series 23: fluoro_barium 2fps_bw · 0.18mm/px · 1 of 1 slices shown (11 of 11)]
[im 1/1]
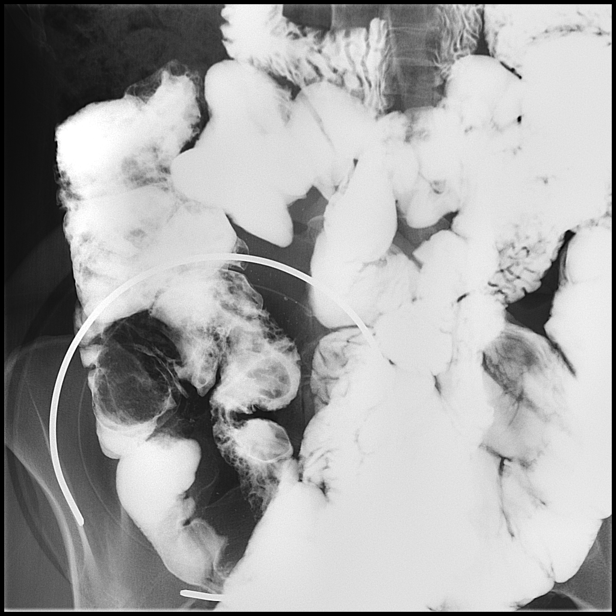

[15 of 23 positions shown; findings below may reference images not displayed]

FINDINGS: The esophageal and gastric mucosal pattern, peristaltic activity,
and contour normal. No focal lesions identified. Duodenum and C-loop
are normal. Proximal small bowel loops appear normal. Subtle
nodularity noted of the terminal ileum. An inflammatory process such
as Crohn's disease or infectious ileitis cannot be excluded. No
evidence of stricture. Prominent amount of stool in the colon.
IMPRESSION: 1. Normal upper GI.

2. Subtle nodularity noted of the terminal ileum. An inflammatory
process such as Crohn's disease or infectious ileitis cannot be
excluded .

## 2017-12-29 ENCOUNTER — Other Ambulatory Visit: Payer: Self-pay

## 2017-12-29 ENCOUNTER — Telehealth: Payer: Self-pay

## 2017-12-29 MED ORDER — FLUCONAZOLE 150 MG PO TABS: 150.0000 mg | ORAL_TABLET | Freq: Once | ORAL | 0 refills | Status: AC

## 2017-12-29 NOTE — Telephone Encounter (Signed)
Pt called in wanting a diflucan sent in- [facility] [place], was told if not better to sched appt to be seen

## 2018-01-31 ENCOUNTER — Other Ambulatory Visit: Payer: Self-pay | Admitting: Orthopedic Surgery

## 2018-01-31 DIAGNOSIS — M25851 Other specified joint disorders, right hip: Secondary | ICD-10-CM

## 2018-02-12 ENCOUNTER — Ambulatory Visit
Admission: RE | Admit: 2018-02-12 | Discharge: 2018-02-12 | Disposition: A | Discharge status: Home / Self Care | Payer: Commercial Managed Care - HMO | Source: Ambulatory Visit | Attending: Orthopedic Surgery | Admitting: Orthopedic Surgery

## 2018-02-12 ENCOUNTER — Other Ambulatory Visit: Payer: Self-pay | Admitting: Orthopedic Surgery

## 2018-02-12 DIAGNOSIS — M25851 Other specified joint disorders, right hip: Secondary | ICD-10-CM | POA: Insufficient documentation

## 2018-02-12 DIAGNOSIS — M533 Sacrococcygeal disorders, not elsewhere classified: Secondary | ICD-10-CM | POA: Diagnosis not present

## 2018-02-12 MED ORDER — LIDOCAINE HCL (PF) 1 % IJ SOLN
5.0000 mL | Freq: Once | INTRAMUSCULAR | Status: AC
  Administered 2018-02-12: 5 mL
  Filled 2018-02-12: qty 5

## 2018-02-12 MED ORDER — GADOBENATE DIMEGLUMINE 529 MG/ML IV SOLN
0.1000 mL | Freq: Once | INTRAVENOUS | Status: AC | PRN
  Administered 2018-02-12: 0.1 mL via INTRA_ARTICULAR

## 2018-02-12 MED ORDER — IOPAMIDOL (ISOVUE-300) INJECTION 61%
15.0000 mL | Freq: Once | INTRAVENOUS | Status: AC | PRN
  Administered 2018-02-12: 15 mL

## 2018-02-12 MED ORDER — SODIUM CHLORIDE 0.9 % IJ SOLN
20.0000 mL | Freq: Once | INTRAMUSCULAR | Status: AC
  Administered 2018-02-12: 20 mL via INTRAVENOUS

## 2018-03-01 ENCOUNTER — Other Ambulatory Visit: Payer: Self-pay | Admitting: Family Medicine

## 2018-03-01 DIAGNOSIS — E039 Hypothyroidism, unspecified: Secondary | ICD-10-CM

## 2018-04-02 ENCOUNTER — Other Ambulatory Visit: Payer: Self-pay | Admitting: Family Medicine

## 2018-04-02 DIAGNOSIS — E039 Hypothyroidism, unspecified: Secondary | ICD-10-CM

## 2018-04-13 ENCOUNTER — Encounter: Payer: Self-pay | Admitting: Family Medicine

## 2018-04-13 ENCOUNTER — Ambulatory Visit: Payer: 59 | Admitting: Family Medicine

## 2018-04-13 VITALS — BP 110/70 | HR 60 | Ht 67.0 in | Wt 125.0 lb

## 2018-04-13 DIAGNOSIS — E039 Hypothyroidism, unspecified: Secondary | ICD-10-CM | POA: Diagnosis not present

## 2018-04-13 DIAGNOSIS — E034 Atrophy of thyroid (acquired): Secondary | ICD-10-CM

## 2018-04-13 DIAGNOSIS — Z111 Encounter for screening for respiratory tuberculosis: Secondary | ICD-10-CM

## 2018-04-13 DIAGNOSIS — F419 Anxiety disorder, unspecified: Secondary | ICD-10-CM | POA: Diagnosis not present

## 2018-04-13 MED ORDER — ALPRAZOLAM 0.25 MG PO TABS: 0.2500 mg | ORAL_TABLET | Freq: Two times a day (BID) | ORAL | 0 refills | Status: AC | PRN

## 2018-04-13 MED ORDER — LEVOTHYROXINE SODIUM 88 MCG PO TABS: 88.0000 ug | ORAL_TABLET | Freq: Every day | ORAL | 3 refills | Status: DC

## 2018-04-13 NOTE — Assessment & Plan Note (Signed)
Refill alprazolam as needed/ stable on med

## 2018-04-13 NOTE — Assessment & Plan Note (Signed)
Pt has lost weight unintentionally- recheck Thyroid

## 2018-04-13 NOTE — Progress Notes (Signed)
Name: Kendra Salinas   MRN: [id number]    DOB: [birth year]   Date:04/13/2018       Progress Note  Subjective  Chief Complaint  Chief Complaint  Patient presents with  . Hypothyroidism    recheck thyroid level  . Anxiety    buspar gives her night terrors. So, would like refill on alprazolam    Anxiety  Presents for follow-up visit. Symptoms include excessive worry, nervous/anxious behavior and panic. Patient reports no chest pain, compulsions, confusion, decreased concentration, depressed mood, dizziness, dry mouth, feeling of choking, hyperventilation, impotence, insomnia, irritability, malaise, muscle tension, nausea, obsessions, palpitations, restlessness, shortness of breath or suicidal ideas. Symptoms occur most days. The quality of sleep is good.   Compliance with medications is 26-50%. Side effects of treatment include joint pain.    Chronic anxiety Refill alprazolam as needed/ stable on med  Hypothyroidism due to acquired atrophy of thyroid Pt has lost weight unintentionally- recheck Thyroid   Past Medical History:  Diagnosis Date  . Gluten free diet    DX with ciliac disease  . Hypothyroidism   . Thyroid disease     Past Surgical History:  Procedure Laterality Date  . ESOPHAGOGASTRODUODENOSCOPY (EGD) WITH PROPOFOL N/A 12/29/2015   Procedure: ESOPHAGOGASTRODUODENOSCOPY (EGD) WITH PROPOFOL;  Surgeon: Christena Deem, MD;  Location: [facility] ENDOSCOPY;  Service: Endoscopy;  Laterality: N/A;    History reviewed. No pertinent family history.  Social History   Socioeconomic History  . Marital status: Single    Spouse name: Not on file  . Number of children: Not on file  . Years of education: Not on file  . Highest education level: Not on file  Occupational History  . Not on file  Social Needs  . Financial resource strain: Not on file  . Food insecurity:    Worry: Not on file    Inability: Not on file  . Transportation needs:    Medical: Not on file     Non-medical: Not on file  Tobacco Use  . Smoking status: Never Smoker  . Smokeless tobacco: Never Used  Substance and Sexual Activity  . Alcohol use: No    Alcohol/week: 0.0 oz  . Drug use: No  . Sexual activity: Yes  Lifestyle  . Physical activity:    Days per week: Not on file    Minutes per session: Not on file  . Stress: Not on file  Relationships  . Social connections:    Talks on phone: Not on file    Gets together: Not on file    Attends religious service: Not on file    Active member of club or organization: Not on file    Attends meetings of clubs or organizations: Not on file    Relationship status: Not on file  . Intimate partner violence:    Fear of current or ex partner: Not on file    Emotionally abused: Not on file    Physically abused: Not on file    Forced sexual activity: Not on file  Other Topics Concern  . Not on file  Social History Narrative  . Not on file    Allergies  Allergen Reactions  . Sulfa Antibiotics Hives  . Amoxicillin Diarrhea  . Latex Swelling    Outpatient Medications Prior to Visit  Medication Sig Dispense Refill  . norgestimate-ethinyl estradiol (ORTHO-CYCLEN,SPRINTEC,PREVIFEM) 0.25-35 MG-MCG tablet Take 1 tablet by mouth daily. [facility]    . traMADol (ULTRAM) 50 MG tablet Take 1 tablet (50  mg total) by mouth 4 (four) times daily. 30 tablet 0  . ALPRAZolam (XANAX) 0.25 MG tablet Take 1 tablet (0.25 mg total) by mouth 2 (two) times daily as needed for anxiety. 20 tablet 0  . levothyroxine (SYNTHROID, LEVOTHROID) 88 MCG tablet TAKE 1 TABLET BY MOUTH EVERY DAY 30 tablet 0  . budesonide (ENTOCORT EC) 3 MG 24 hr capsule Take 3 mg daily by mouth.    . levonorgestrel (MIRENA) 20 MCG/24HR IUD 1 each by Intrauterine route once.     No facility-administered medications prior to visit.     Review of Systems  Constitutional: Negative for chills, fever, irritability, malaise/fatigue and weight loss.  HENT: Negative for ear discharge,  ear pain and sore throat.   Eyes: Negative for blurred vision.  Respiratory: Negative for cough, sputum production, shortness of breath and wheezing.   Cardiovascular: Negative for chest pain, palpitations and leg swelling.  Gastrointestinal: Negative for abdominal pain, blood in stool, constipation, diarrhea, heartburn, melena and nausea.  Genitourinary: Negative for dysuria, frequency, hematuria, impotence and urgency.  Musculoskeletal: Negative for back pain, joint pain, myalgias and neck pain.  Skin: Negative for rash.  Neurological: Negative for dizziness, tingling, sensory change, focal weakness and headaches.  Endo/Heme/Allergies: Negative for environmental allergies and polydipsia. Does not bruise/bleed easily.  Psychiatric/Behavioral: Negative for confusion, decreased concentration, depression and suicidal ideas. The patient is nervous/anxious. The patient does not have insomnia.      Objective  Vitals:   04/13/18 1031  BP: 110/70  Pulse: 60  Weight: 125 lb (56.7 kg)  Height: 5\' 7"  (1.702 m)    Physical Exam  Constitutional: She is oriented to person, place, and time. She appears well-developed and well-nourished.  HENT:  Head: Normocephalic.  Right Ear: External ear normal.  Left Ear: External ear normal.  Mouth/Throat: Oropharynx is clear and moist.  Eyes: Pupils are equal, round, and reactive to light. Conjunctivae and EOM are normal. Lids are everted and swept, no foreign bodies found. Left eye exhibits no hordeolum. No foreign body present in the left eye. Right conjunctiva is not injected. Left conjunctiva is not injected. No scleral icterus.  Neck: Normal range of motion. Neck supple. No JVD present. No tracheal deviation present. No thyromegaly present.  Cardiovascular: Normal rate, regular rhythm, normal heart sounds and intact distal pulses. Exam reveals no gallop and no friction rub.  No murmur heard. Pulmonary/Chest: Effort normal and breath sounds normal. No  respiratory distress. She has no wheezes. She has no rales.  Abdominal: Soft. Bowel sounds are normal. She exhibits no mass. There is no hepatosplenomegaly. There is no tenderness. There is no rebound and no guarding.  Musculoskeletal: Normal range of motion. She exhibits no edema or tenderness.  Lymphadenopathy:    She has no cervical adenopathy.  Neurological: She is alert and oriented to person, place, and time. She has normal strength. She displays normal reflexes. No cranial nerve deficit.  Skin: Skin is warm. No rash noted.  Psychiatric: She has a normal mood and affect. Her mood appears not anxious. She does not exhibit a depressed mood.  Nursing note and vitals reviewed.     Assessment & Plan  Problem List Items Addressed This Visit      Endocrine   Hypothyroidism due to acquired atrophy of thyroid    Pt has lost weight unintentionally- recheck Thyroid      Relevant Medications   levothyroxine (SYNTHROID, LEVOTHROID) 88 MCG tablet     Other   Chronic anxiety  Refill alprazolam as needed/ stable on med      Relevant Medications   ALPRAZolam (XANAX) 0.25 MG tablet    Other Visit Diagnoses    Hypothyroidism    -  Primary   Relevant Medications   levothyroxine (SYNTHROID, LEVOTHROID) 88 MCG tablet   Other Relevant Orders   T4 AND TSH   Screening-pulmonary TB       TB placement   Relevant Orders   TB Skin Test (Completed)      Meds ordered this encounter  Medications  . ALPRAZolam (XANAX) 0.25 MG tablet    Sig: Take 1 tablet (0.25 mg total) by mouth 2 (two) times daily as needed for anxiety.    Dispense:  30 tablet    Refill:  0  . levothyroxine (SYNTHROID, LEVOTHROID) 88 MCG tablet    Sig: Take 1 tablet (88 mcg total) by mouth daily.    Dispense:  90 tablet    Refill:  3    Needs labs and med refill   Pt had unintentional weight loss- recheck thyroid level   Dr. Hayden Rasmusseneanna Twilia Yaklin [facility]  04/13/18

## 2018-04-14 LAB — T4 AND TSH
T4, Total: 12.1 ug/dL — ABNORMAL HIGH (ref 4.5–12.0)
TSH: 0.388 u[IU]/mL — AB (ref 0.450–4.500)

## 2018-04-16 ENCOUNTER — Other Ambulatory Visit: Payer: Self-pay

## 2018-04-16 LAB — TB SKIN TEST
INDURATION: 0 mm
TB Skin Test: NEGATIVE

## 2018-04-16 MED ORDER — LEVOTHYROXINE SODIUM 75 MCG PO TABS: 75.0000 ug | ORAL_TABLET | Freq: Every day | ORAL | 1 refills | Status: DC

## 2018-05-21 ENCOUNTER — Other Ambulatory Visit: Payer: Self-pay | Admitting: Gastroenterology

## 2018-05-21 DIAGNOSIS — R110 Nausea: Secondary | ICD-10-CM

## 2018-05-28 ENCOUNTER — Ambulatory Visit: Payer: 59 | Admitting: Family Medicine

## 2018-05-28 ENCOUNTER — Encounter: Payer: Self-pay | Admitting: Family Medicine

## 2018-05-28 VITALS — BP 120/80 | HR 80 | Ht 67.0 in | Wt 124.0 lb

## 2018-05-28 DIAGNOSIS — E039 Hypothyroidism, unspecified: Secondary | ICD-10-CM | POA: Diagnosis not present

## 2018-05-28 NOTE — Assessment & Plan Note (Signed)
Draw TSH level and decide whether to continue present dosage or make a change

## 2018-05-28 NOTE — Progress Notes (Signed)
Name: Kendra Salinas   MRN: [id number]    DOB: [birth year]   Date:05/28/2018       Progress Note  Subjective  Chief Complaint  Chief Complaint  Patient presents with  . Hypothyroidism    recheck thyroid med since being on the 75mcg.     Thyroid Problem  Presents for follow-up visit. Symptoms include cold intolerance, constipation, dry skin, menstrual problem and weight loss. Patient reports no anxiety, depressed mood, diaphoresis, diarrhea, fatigue, hair loss, heat intolerance, hoarse voice, leg swelling, nail problem, palpitations, tremors, visual change or weight gain. The symptoms have been stable.    Adult hypothyroidism Draw TSH level and decide whether to continue present dosage or make a change   Past Medical History:  Diagnosis Date  . Gluten free diet    DX with ciliac disease  . Hypothyroidism   . Thyroid disease     Past Surgical History:  Procedure Laterality Date  . ESOPHAGOGASTRODUODENOSCOPY (EGD) WITH PROPOFOL N/A 12/29/2015   Procedure: ESOPHAGOGASTRODUODENOSCOPY (EGD) WITH PROPOFOL;  Surgeon: Christena DeemMartin U Skulskie, MD;  [facility];  Service: Endoscopy;  Laterality: N/A;    History reviewed. No pertinent family history.  Social History   Socioeconomic History  . Marital status: Single    Spouse name: Not on file  . Number of children: Not on file  . Years of education: Not on file  . Highest education level: Not on file  Occupational History  . Not on file  Social Needs  . Financial resource strain: Not on file  . Food insecurity:    Worry: Not on file    Inability: Not on file  . Transportation needs:    Medical: Not on file    Non-medical: Not on file  Tobacco Use  . Smoking status: Never Smoker  . Smokeless tobacco: Never Used  Substance and Sexual Activity  . Alcohol use: No    Alcohol/week: 0.0 standard drinks  . Drug use: No  . Sexual activity: Yes  Lifestyle  . Physical activity:    Days per week: Not on file    Minutes  per session: Not on file  . Stress: Not on file  Relationships  . Social connections:    Talks on phone: Not on file    Gets together: Not on file    Attends religious service: Not on file    Active member of club or organization: Not on file    Attends meetings of clubs or organizations: Not on file    Relationship status: Not on file  . Intimate partner violence:    Fear of current or ex partner: Not on file    Emotionally abused: Not on file    Physically abused: Not on file    Forced sexual activity: Not on file  Other Topics Concern  . Not on file  Social History Narrative  . Not on file    Allergies  Allergen Reactions  . Sulfa Antibiotics Hives  . Amoxicillin Diarrhea  . Latex Swelling    Outpatient Medications Prior to Visit  Medication Sig Dispense Refill  . ALPRAZolam (XANAX) 0.25 MG tablet Take 1 tablet (0.25 mg total) by mouth 2 (two) times daily as needed for anxiety. 30 tablet 0  . levothyroxine (SYNTHROID, LEVOTHROID) 75 MCG tablet Take 1 tablet (75 mcg total) by mouth daily. 30 tablet 1  . norgestimate-ethinyl estradiol (ORTHO-CYCLEN,SPRINTEC,PREVIFEM) 0.25-35 MG-MCG tablet Take 1 tablet by mouth daily. [facility]    . traMADol (ULTRAM) 50  MG tablet Take 1 tablet (50 mg total) by mouth 4 (four) times daily. 30 tablet 0   No facility-administered medications prior to visit.     Review of Systems  Constitutional: Positive for weight loss. Negative for chills, diaphoresis, fatigue, fever, malaise/fatigue and weight gain.  HENT: Negative for ear discharge, ear pain, hoarse voice and sore throat.   Eyes: Negative for blurred vision.  Respiratory: Negative for cough, sputum production, shortness of breath and wheezing.   Cardiovascular: Negative for chest pain, palpitations and leg swelling.  Gastrointestinal: Positive for constipation. Negative for abdominal pain, blood in stool, diarrhea, heartburn, melena and nausea.  Genitourinary: Positive for menstrual  problem. Negative for dysuria, frequency, hematuria and urgency.  Musculoskeletal: Negative for back pain, joint pain, myalgias and neck pain.  Skin: Negative for rash.  Neurological: Negative for dizziness, tingling, tremors, sensory change, focal weakness and headaches.  Endo/Heme/Allergies: Positive for cold intolerance. Negative for environmental allergies, heat intolerance and polydipsia. Does not bruise/bleed easily.  Psychiatric/Behavioral: Negative for depression and suicidal ideas. The patient is not nervous/anxious and does not have insomnia.      Objective  Vitals:   05/28/18 1344  BP: 120/80  Pulse: 80  Weight: 124 lb (56.2 kg)  Height: 5\' 7"  (1.702 m)    Physical Exam  Constitutional: She is oriented to person, place, and time. She appears well-developed and well-nourished.  HENT:  Head: Normocephalic.  Right Ear: External ear normal.  Left Ear: External ear normal.  Mouth/Throat: Oropharynx is clear and moist.  Eyes: Pupils are equal, round, and reactive to light. Conjunctivae and EOM are normal. Lids are everted and swept, no foreign bodies found. Left eye exhibits no hordeolum. No foreign body present in the left eye. Right conjunctiva is not injected. Left conjunctiva is not injected. No scleral icterus.  Neck: Normal range of motion. Neck supple. No JVD present. No tracheal deviation present. No thyromegaly present.  Cardiovascular: Normal rate, regular rhythm, normal heart sounds and intact distal pulses. Exam reveals no gallop and no friction rub.  No murmur heard. Pulmonary/Chest: Effort normal and breath sounds normal. No respiratory distress. She has no wheezes. She has no rales.  Abdominal: Soft. Bowel sounds are normal. She exhibits no mass. There is no hepatosplenomegaly. There is no tenderness. There is no rebound and no guarding.  Musculoskeletal: Normal range of motion. She exhibits no edema or tenderness.  Lymphadenopathy:    She has no cervical  adenopathy.  Neurological: She is alert and oriented to person, place, and time. She has normal strength. She displays normal reflexes. No cranial nerve deficit.  Skin: Skin is warm. No rash noted.  Psychiatric: She has a normal mood and affect. Her mood appears not anxious. She does not exhibit a depressed mood.  Nursing note and vitals reviewed.     Assessment & Plan  Problem List Items Addressed This Visit      Endocrine   Adult hypothyroidism - Primary    Draw TSH level and decide whether to continue present dosage or make a change      Relevant Orders   TSH      No orders of the defined types were placed in this encounter.     Dr. Hayden Rasmusseneanna Rashied Corallo [facility]  05/28/18

## 2018-05-29 ENCOUNTER — Other Ambulatory Visit: Payer: Self-pay

## 2018-05-29 ENCOUNTER — Ambulatory Visit: Payer: 59

## 2018-05-29 LAB — TSH: TSH: 0.322 u[IU]/mL — AB (ref 0.450–4.500)

## 2018-05-29 MED ORDER — FLUCONAZOLE 150 MG PO TABS: 150.0000 mg | ORAL_TABLET | Freq: Once | ORAL | 0 refills | Status: AC

## 2018-05-29 MED ORDER — LEVOTHYROXINE SODIUM 50 MCG PO TABS: 50.0000 ug | ORAL_TABLET | Freq: Every day | ORAL | 1 refills | Status: AC

## 2018-06-06 ENCOUNTER — Ambulatory Visit: Payer: 59

## 2018-06-06 ENCOUNTER — Other Ambulatory Visit: Payer: 59

## 2018-06-11 ENCOUNTER — Ambulatory Visit: Payer: 59

## 2018-07-03 ENCOUNTER — Ambulatory Visit: Payer: 59

## 2023-11-04 ENCOUNTER — Other Ambulatory Visit (HOSPITAL_COMMUNITY): Payer: Self-pay
